# Patient Record
Sex: Female | Born: 1965 | Race: White | Hispanic: No | Marital: Married | Smoking: Current every day smoker
Health system: Southern US, Community
[De-identification: ages and names within clinical notes are randomized; demographics above are authoritative.]

## PROBLEM LIST (undated history)

## (undated) DIAGNOSIS — F32A Depression, unspecified: Secondary | ICD-10-CM

## (undated) DIAGNOSIS — F319 Bipolar disorder, unspecified: Secondary | ICD-10-CM

## (undated) DIAGNOSIS — F329 Major depressive disorder, single episode, unspecified: Secondary | ICD-10-CM

## (undated) DIAGNOSIS — J449 Chronic obstructive pulmonary disease, unspecified: Secondary | ICD-10-CM

## (undated) DIAGNOSIS — F419 Anxiety disorder, unspecified: Secondary | ICD-10-CM

## (undated) HISTORY — DX: Major depressive disorder, single episode, unspecified: F32.9

## (undated) HISTORY — PX: OTHER SURGICAL HISTORY: SHX169

## (undated) HISTORY — PX: FOOT SURGERY: SHX648

## (undated) HISTORY — PX: KNEE SURGERY: SHX244

## (undated) HISTORY — PX: VAGINAL HYSTERECTOMY: SUR661

## (undated) HISTORY — DX: Anxiety disorder, unspecified: F41.9

## (undated) HISTORY — DX: Bipolar disorder, unspecified: F31.9

## (undated) HISTORY — DX: Depression, unspecified: F32.A

---

## 1999-02-07 ENCOUNTER — Other Ambulatory Visit: Admission: RE | Admit: 1999-02-07 | Discharge: 1999-02-07 | Payer: Self-pay | Admitting: Obstetrics and Gynecology

## 1999-05-13 HISTORY — PX: VAGINAL HYSTERECTOMY: SUR661

## 1999-07-15 ENCOUNTER — Observation Stay (HOSPITAL_COMMUNITY): Admission: RE | Admit: 1999-07-15 | Discharge: 1999-07-16 | Payer: Self-pay | Admitting: Obstetrics and Gynecology

## 1999-07-15 ENCOUNTER — Encounter (INDEPENDENT_AMBULATORY_CARE_PROVIDER_SITE_OTHER): Payer: Self-pay | Admitting: Specialist

## 1999-12-02 ENCOUNTER — Encounter: Payer: Self-pay | Admitting: Obstetrics and Gynecology

## 1999-12-02 ENCOUNTER — Ambulatory Visit (HOSPITAL_COMMUNITY): Admission: RE | Admit: 1999-12-02 | Discharge: 1999-12-02 | Payer: Self-pay | Admitting: Obstetrics and Gynecology

## 2003-05-07 ENCOUNTER — Inpatient Hospital Stay (HOSPITAL_COMMUNITY): Admission: EM | Admit: 2003-05-07 | Discharge: 2003-05-07 | Payer: Self-pay | Admitting: Emergency Medicine

## 2003-05-08 ENCOUNTER — Other Ambulatory Visit (HOSPITAL_COMMUNITY): Admission: RE | Admit: 2003-05-08 | Discharge: 2003-05-10 | Payer: Self-pay | Admitting: Psychiatry

## 2003-05-18 ENCOUNTER — Encounter: Admission: RE | Admit: 2003-05-18 | Discharge: 2003-05-18 | Payer: Self-pay | Admitting: Family Medicine

## 2006-09-18 ENCOUNTER — Ambulatory Visit: Payer: Self-pay | Admitting: Family Medicine

## 2006-09-18 DIAGNOSIS — F3289 Other specified depressive episodes: Secondary | ICD-10-CM | POA: Insufficient documentation

## 2006-09-18 DIAGNOSIS — F411 Generalized anxiety disorder: Secondary | ICD-10-CM | POA: Insufficient documentation

## 2006-09-18 DIAGNOSIS — D492 Neoplasm of unspecified behavior of bone, soft tissue, and skin: Secondary | ICD-10-CM

## 2006-09-18 DIAGNOSIS — F329 Major depressive disorder, single episode, unspecified: Secondary | ICD-10-CM

## 2006-09-18 DIAGNOSIS — M130 Polyarthritis, unspecified: Secondary | ICD-10-CM

## 2006-09-18 DIAGNOSIS — F17200 Nicotine dependence, unspecified, uncomplicated: Secondary | ICD-10-CM

## 2006-09-18 DIAGNOSIS — F319 Bipolar disorder, unspecified: Secondary | ICD-10-CM | POA: Insufficient documentation

## 2006-09-18 DIAGNOSIS — M545 Low back pain: Secondary | ICD-10-CM

## 2006-09-21 ENCOUNTER — Encounter (INDEPENDENT_AMBULATORY_CARE_PROVIDER_SITE_OTHER): Payer: Self-pay | Admitting: Family Medicine

## 2006-09-21 ENCOUNTER — Ambulatory Visit (HOSPITAL_COMMUNITY): Admission: RE | Admit: 2006-09-21 | Discharge: 2006-09-21 | Payer: Self-pay | Admitting: Family Medicine

## 2006-09-21 LAB — CONVERTED CEMR LAB
ALT: 22 units/L (ref 0–35)
Alkaline Phosphatase: 61 units/L (ref 39–117)
Basophils Absolute: 0 10*3/uL (ref 0.0–0.1)
Basophils Relative: 0 % (ref 0–1)
CO2: 21 meq/L (ref 19–32)
Creatinine, Ser: 0.65 mg/dL (ref 0.40–1.20)
Eosinophils Absolute: 0 10*3/uL (ref 0.0–0.7)
Eosinophils Relative: 0 % (ref 0–5)
HCT: 42.3 % (ref 36.0–46.0)
Lymphs Abs: 2 10*3/uL (ref 0.7–3.3)
MCHC: 33.6 g/dL (ref 30.0–36.0)
MCV: 96.6 fL (ref 78.0–100.0)
Platelets: 414 10*3/uL — ABNORMAL HIGH (ref 150–400)
RDW: 13.5 % (ref 11.5–14.0)
Sed Rate: 15 mm/hr (ref 0–22)
TSH: 1.3 microintl units/mL (ref 0.350–5.50)
Total Bilirubin: 0.3 mg/dL (ref 0.3–1.2)

## 2006-09-22 ENCOUNTER — Encounter (INDEPENDENT_AMBULATORY_CARE_PROVIDER_SITE_OTHER): Payer: Self-pay | Admitting: Family Medicine

## 2006-09-25 ENCOUNTER — Telehealth (INDEPENDENT_AMBULATORY_CARE_PROVIDER_SITE_OTHER): Payer: Self-pay | Admitting: *Deleted

## 2006-10-02 ENCOUNTER — Ambulatory Visit (HOSPITAL_COMMUNITY): Admission: RE | Admit: 2006-10-02 | Discharge: 2006-10-02 | Payer: Self-pay | Admitting: Family Medicine

## 2006-10-06 ENCOUNTER — Telehealth (INDEPENDENT_AMBULATORY_CARE_PROVIDER_SITE_OTHER): Payer: Self-pay | Admitting: Family Medicine

## 2006-10-15 ENCOUNTER — Ambulatory Visit: Payer: Self-pay | Admitting: Family Medicine

## 2006-10-15 DIAGNOSIS — R7982 Elevated C-reactive protein (CRP): Secondary | ICD-10-CM

## 2006-10-15 DIAGNOSIS — D7289 Other specified disorders of white blood cells: Secondary | ICD-10-CM

## 2006-10-16 ENCOUNTER — Encounter (INDEPENDENT_AMBULATORY_CARE_PROVIDER_SITE_OTHER): Payer: Self-pay | Admitting: Family Medicine

## 2006-10-16 DIAGNOSIS — M25559 Pain in unspecified hip: Secondary | ICD-10-CM | POA: Insufficient documentation

## 2006-10-16 DIAGNOSIS — M25569 Pain in unspecified knee: Secondary | ICD-10-CM

## 2006-10-19 ENCOUNTER — Telehealth (INDEPENDENT_AMBULATORY_CARE_PROVIDER_SITE_OTHER): Payer: Self-pay | Admitting: Family Medicine

## 2007-09-05 ENCOUNTER — Encounter: Admission: RE | Admit: 2007-09-05 | Discharge: 2007-09-05 | Payer: Self-pay | Admitting: Orthopedic Surgery

## 2007-10-07 ENCOUNTER — Ambulatory Visit (HOSPITAL_BASED_OUTPATIENT_CLINIC_OR_DEPARTMENT_OTHER): Admission: RE | Admit: 2007-10-07 | Discharge: 2007-10-07 | Payer: Self-pay | Admitting: Orthopedic Surgery

## 2008-08-31 IMAGING — CR DG LUMBAR SPINE 2-3V
3 series · 3 of 3 positions shown · non-contrast
Comparison: none

CLINICAL DATA: Low back pain

LUMBAR SPINE - 2-3 VIEW:

[view not recorded (1 of 3)]
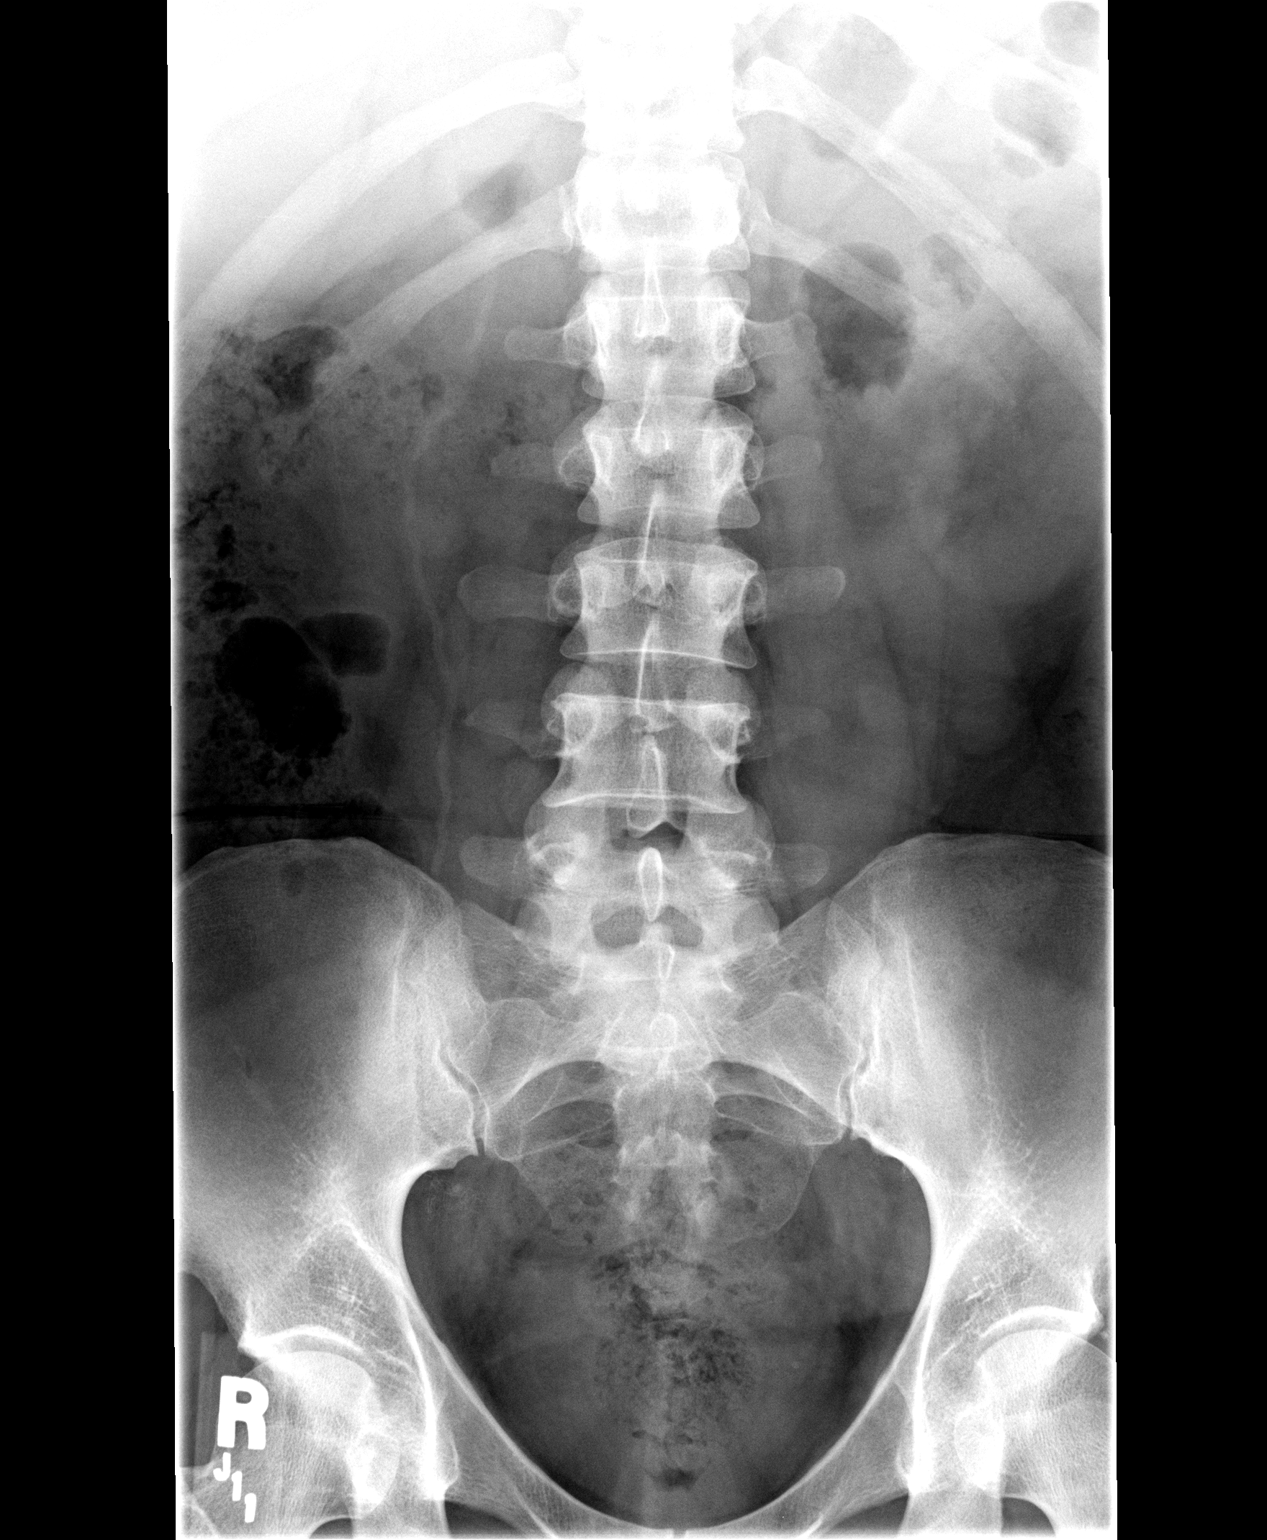

[view not recorded (2 of 3)]
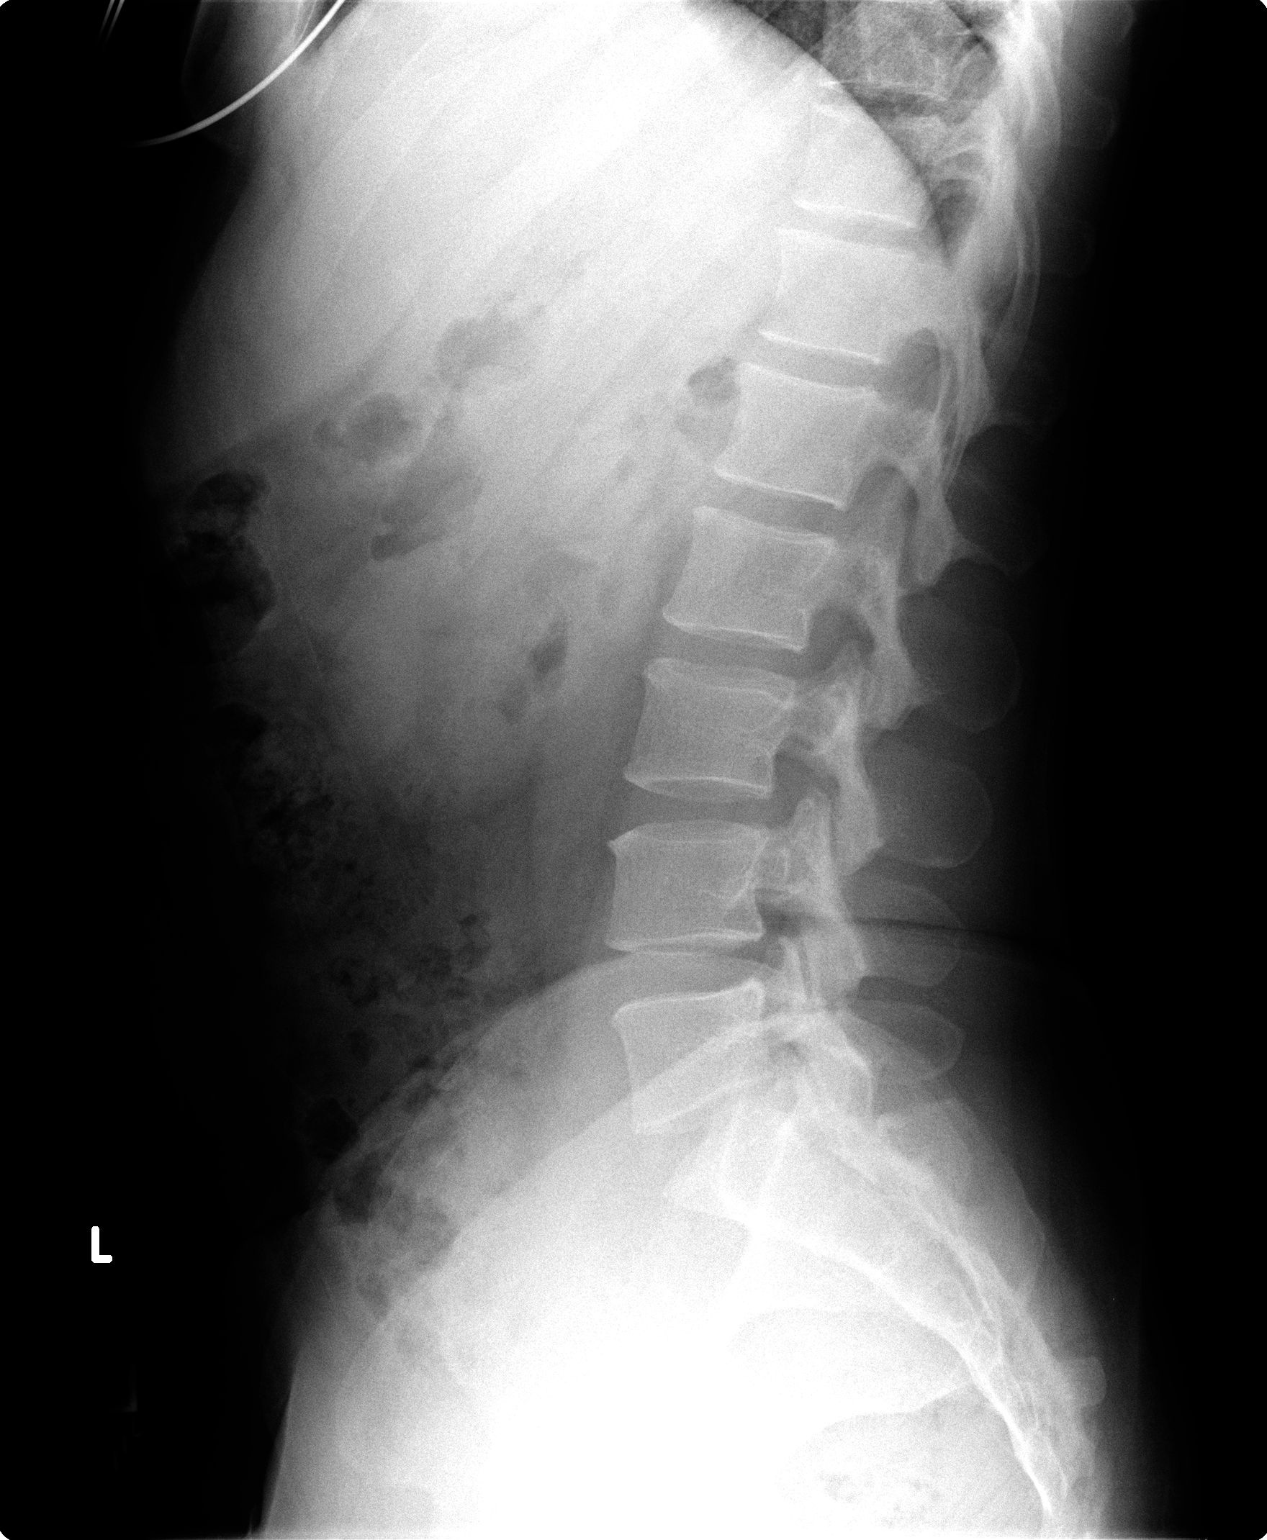

[view not recorded (3 of 3)]
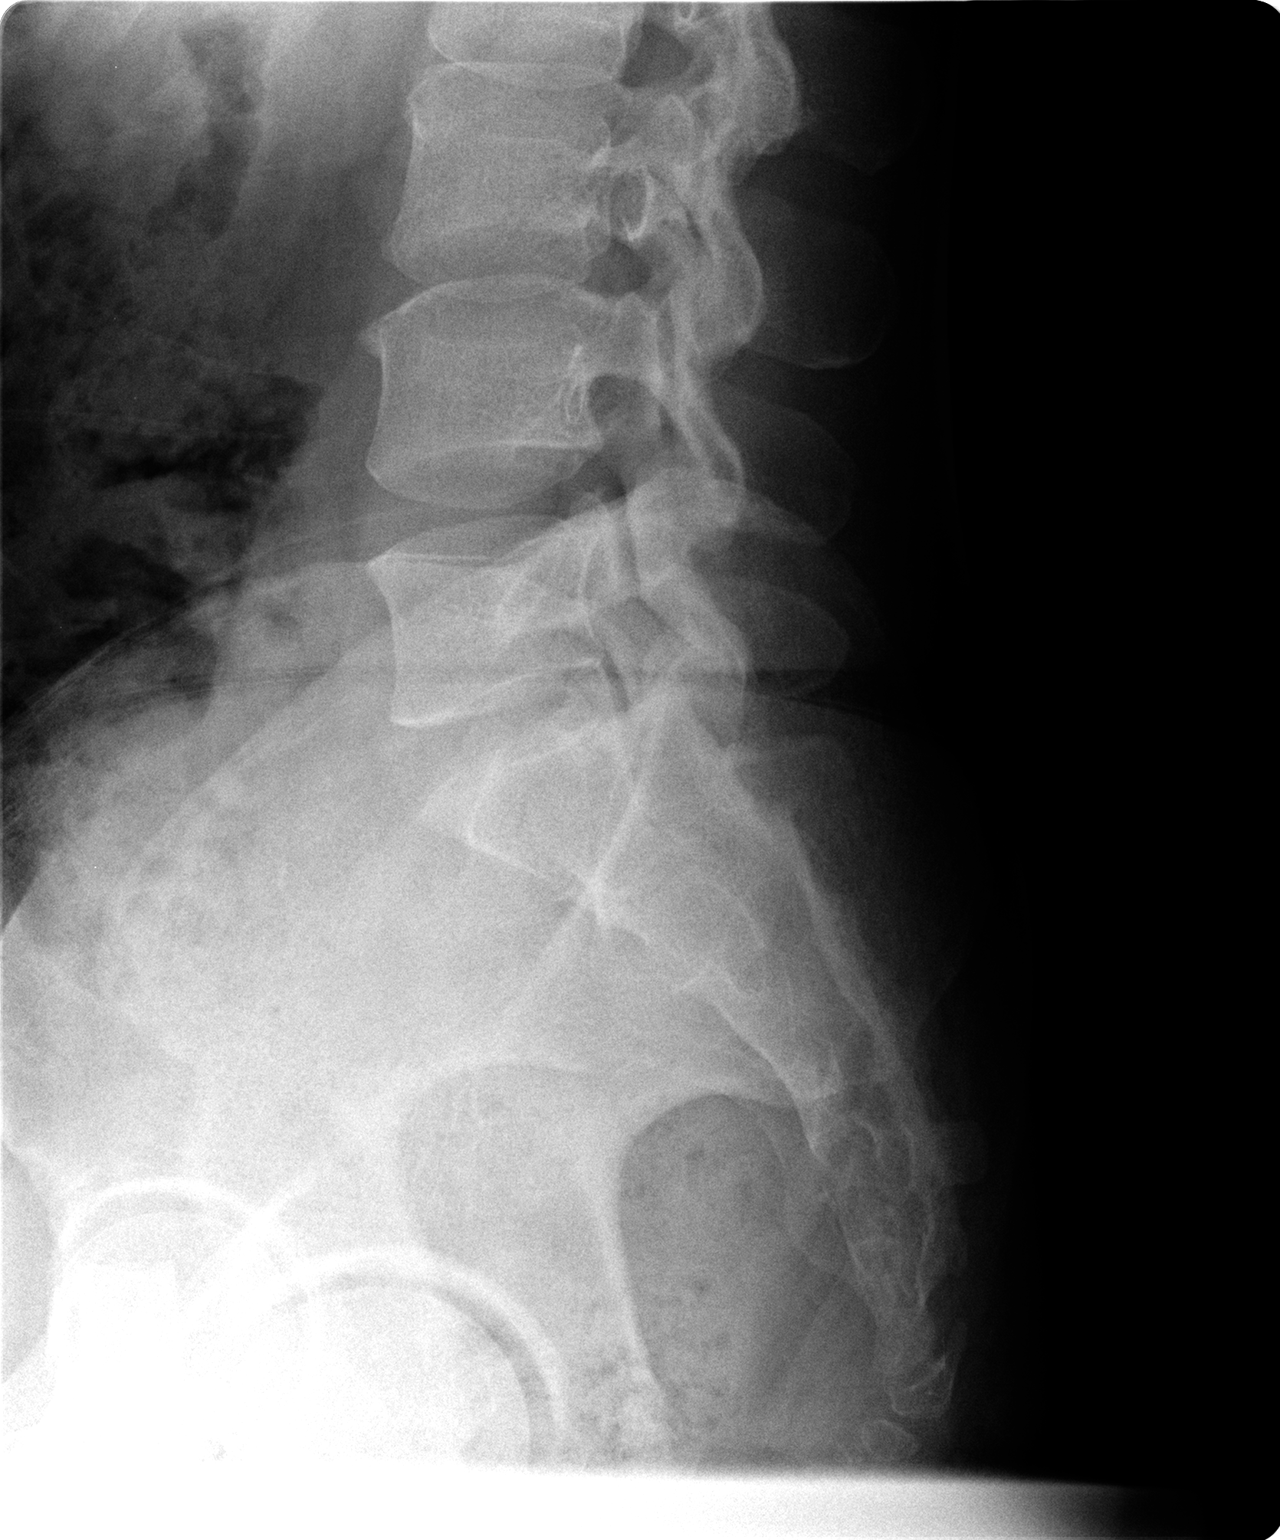

[3 of 3 positions shown; findings below may reference images not displayed]

FINDINGS: There is no evidence of lumbar spine fracture.  Alignment is normal. 
Intervertebral disc spaces are maintained, and no other significant bone
abnormalities are identified.
IMPRESSION: Negative lumbar spine radiographs.

## 2008-08-31 IMAGING — CR DG HIP COMPLETE 2+V*R*
3 series · 3 of 3 positions shown · non-contrast
Comparison: none

CLINICAL DATA: Low back pain, right leg radiculopathy.

RIGHT HIP - 2  VIEW AND PELVIS - 1 VIEW

[view not recorded (1 of 3)]
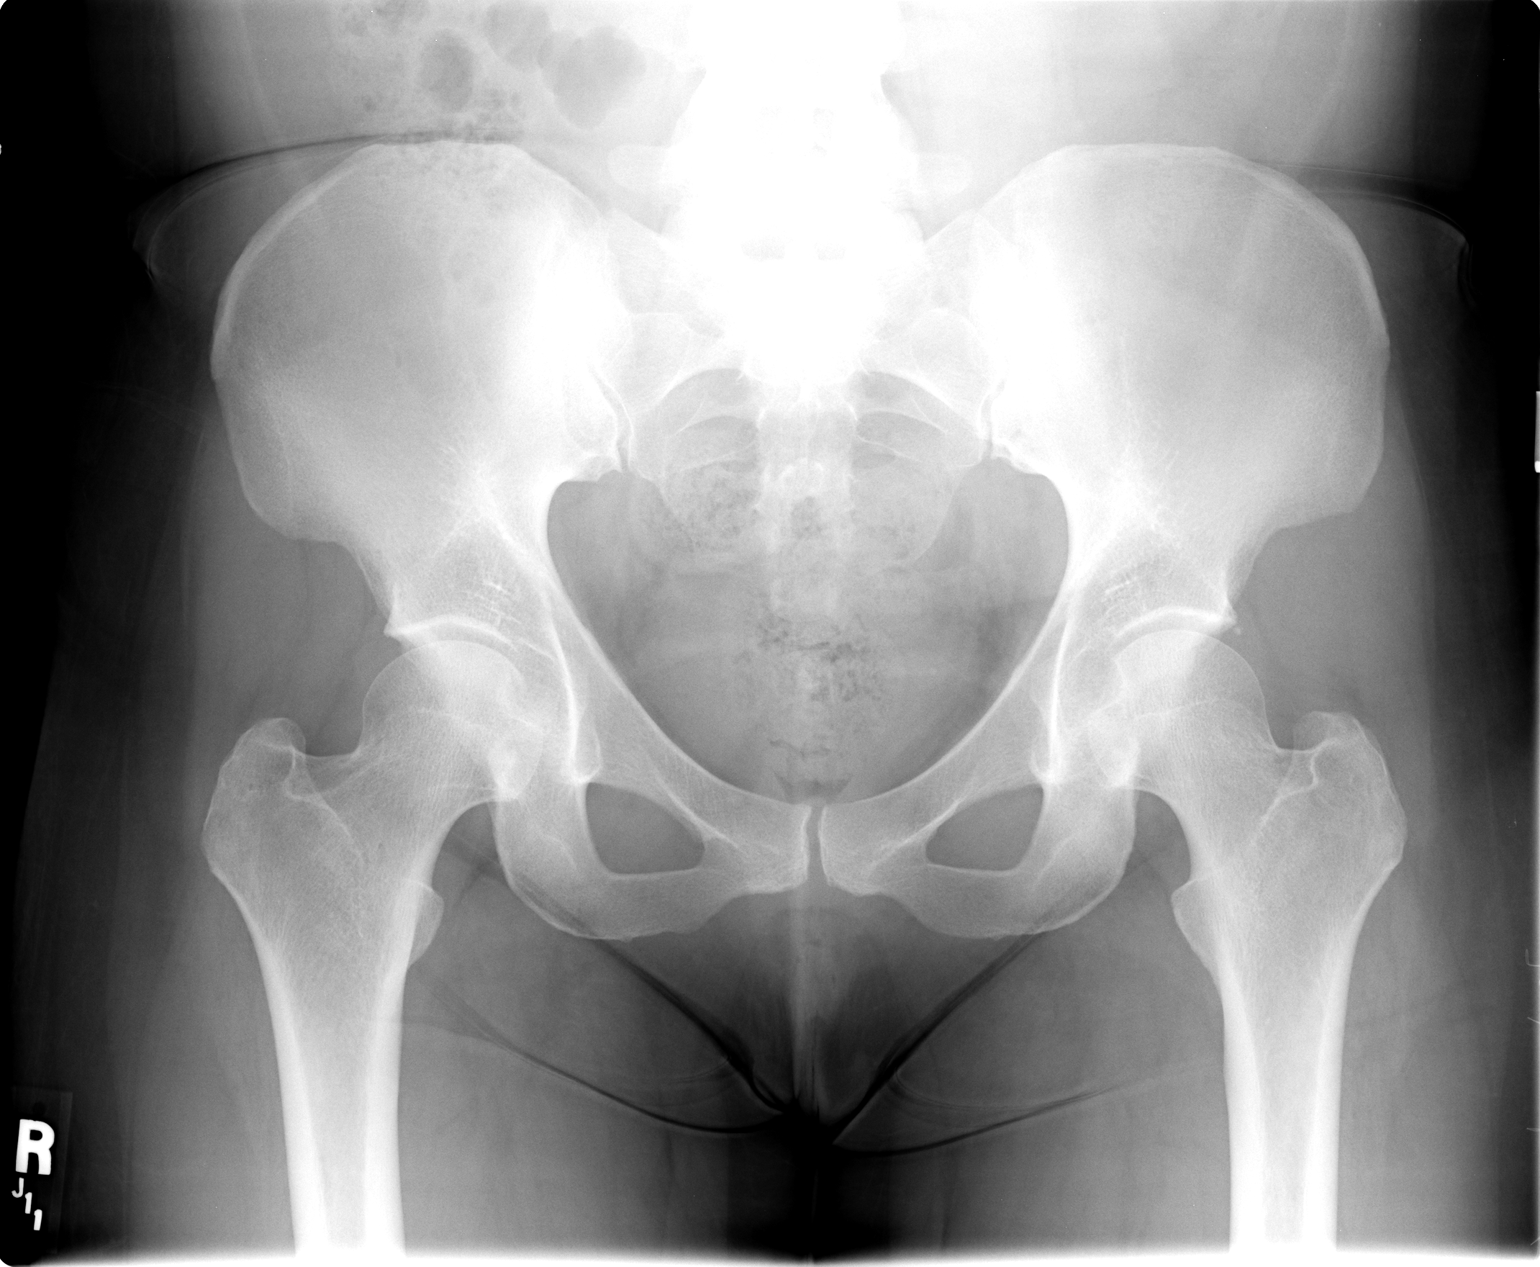

[view not recorded (2 of 3)]
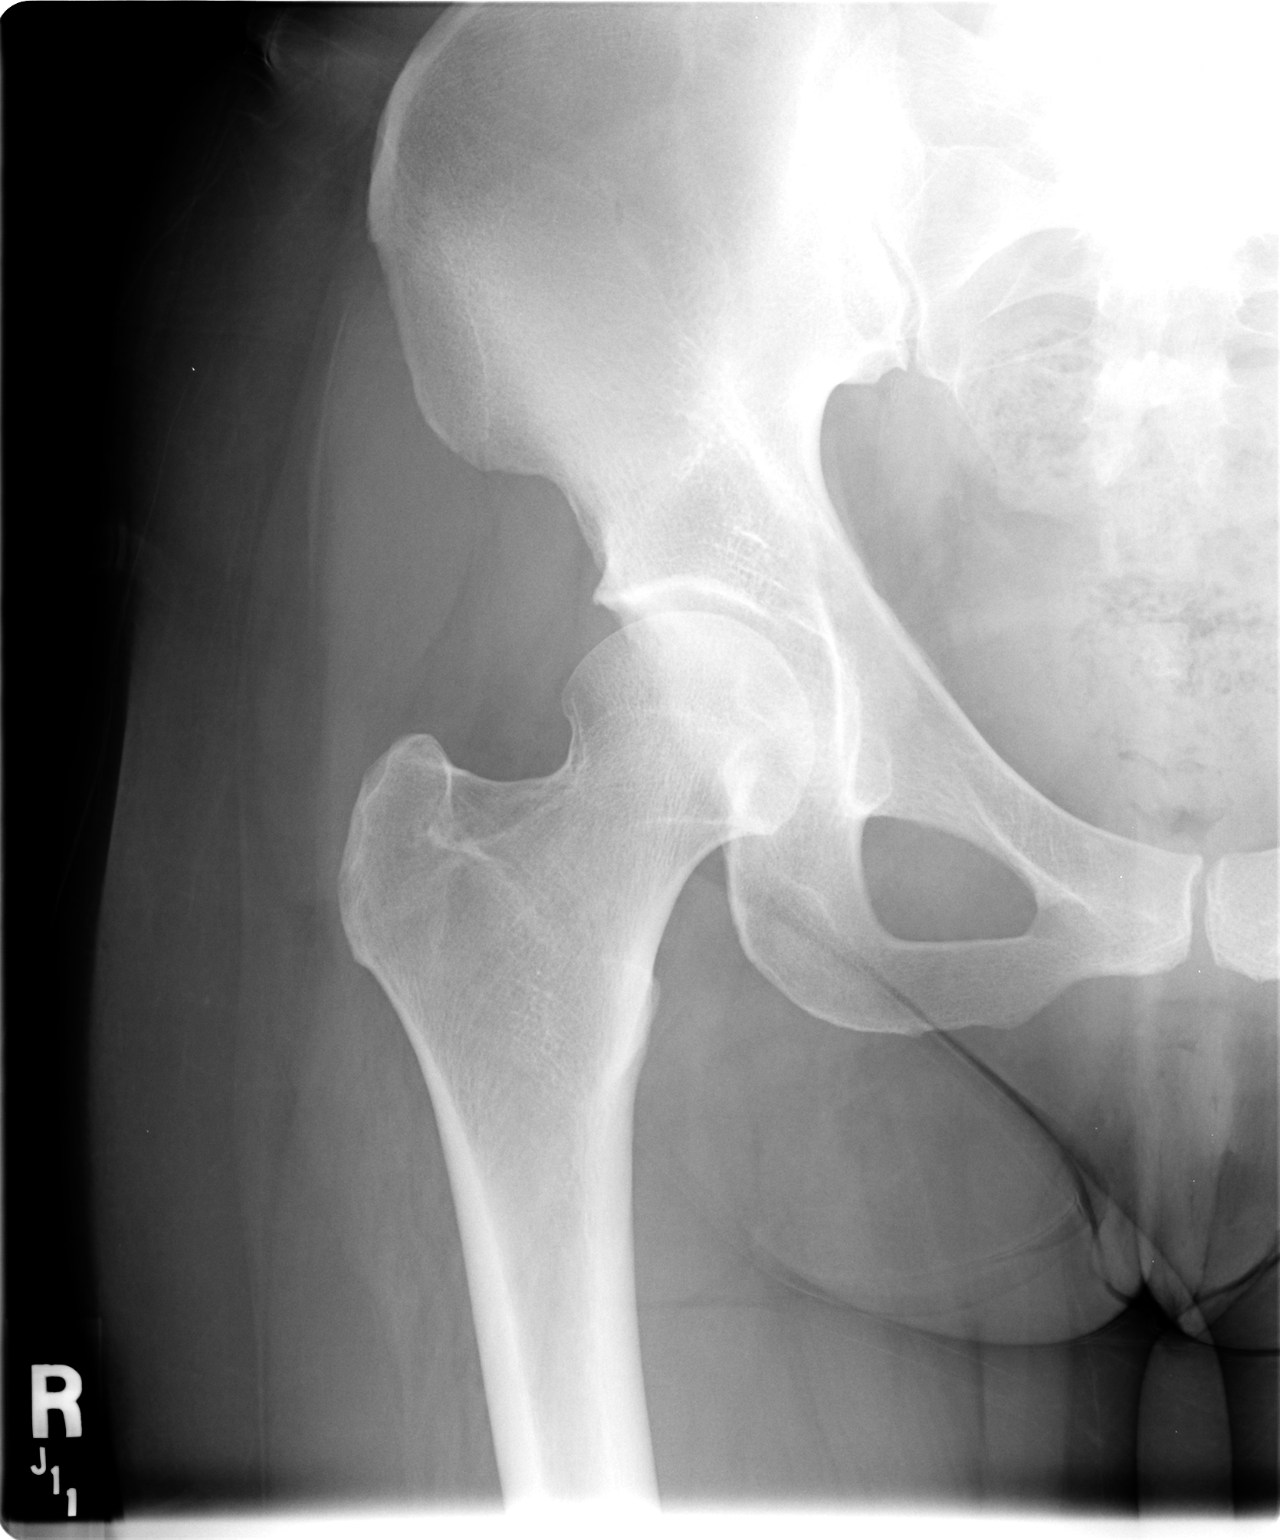

[view not recorded (3 of 3)]
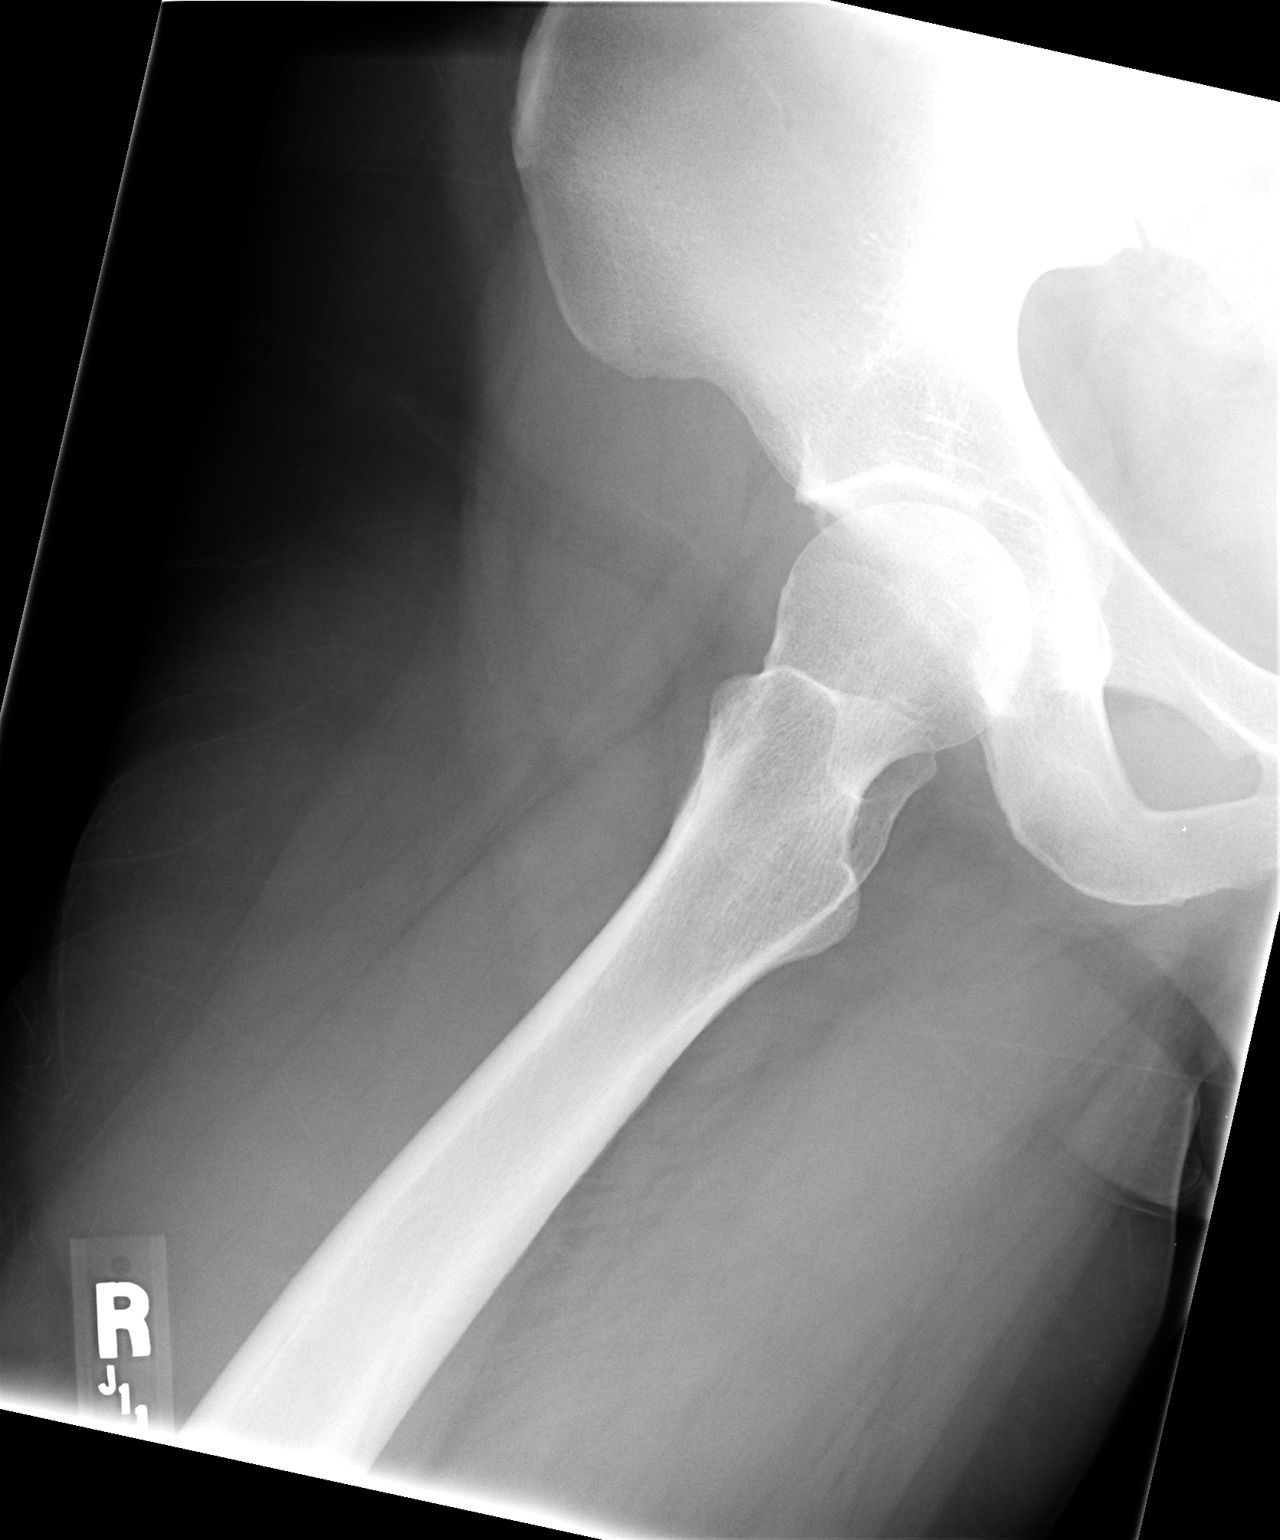

[3 of 3 positions shown; findings below may reference images not displayed]

FINDINGS: There is no evidence of hip fracture or dislocation.  There is no
evidence of arthropathy or other focal bone abnormality involving the hip or
pelvis.

IMPRESSION

Negative.

## 2008-11-03 ENCOUNTER — Ambulatory Visit: Payer: Self-pay | Admitting: Cardiology

## 2008-11-03 ENCOUNTER — Ambulatory Visit (HOSPITAL_COMMUNITY): Admission: RE | Admit: 2008-11-03 | Discharge: 2008-11-03 | Payer: Self-pay | Admitting: Pulmonary Disease

## 2008-11-03 ENCOUNTER — Encounter (INDEPENDENT_AMBULATORY_CARE_PROVIDER_SITE_OTHER): Payer: Self-pay | Admitting: Pulmonary Disease

## 2010-06-02 ENCOUNTER — Encounter: Payer: Self-pay | Admitting: Family Medicine

## 2010-09-24 NOTE — Op Note (Signed)
Teresa Williamson, CHALUPA             ACCOUNT NO.:  [id number]   MEDICAL RECORD NO.:  [id number]          PATIENT TYPE:  AMB   LOCATION:  DSC                          FACILITY:  [facility]   PHYSICIAN:  Rodney A. Mortenson, M.D.DATE OF BIRTH:  [birth year]   DATE OF PROCEDURE:  10/07/2007  DATE OF DISCHARGE:                               OPERATIVE REPORT   JUSTIFICATION:  A 45 year old female referred to [place] again with exam by  Dr. [name] in [place] for evaluation of right knee.  Two years ago,  she first started having trouble with her knee.  She continued to have  pain and discomfort along the medial joint line and some catching  sensations.  Symptoms are progressively getting worse, and she seeks  evaluation.  An MRI shows an oblique tear of the posterior horn and the  body of the medial meniscus without displacement.  Some mild  chondromalacia in the medial lateral compartments.  Because of  persistent pain and discomfort, she is now admitted for arthroscopic  evaluation and treatment.  Complications were discussed extensively.  Questions answered and encouraged, and the patient wishes to proceed.   JUSTIFICATION OF PATIENT SURGERY:  Minimal morbidity.   PREOPERATIVE DIAGNOSIS:  Tear, posterior horn medial meniscus, right  knee.   POSTOPERATIVE DIAGNOSIS:  Tear, posterior horn medial meniscus, right  knee.   OPERATION:  Debride posterior horn medial meniscus, right knee.   SURGEON:  Lenard Galloway. Chaney Malling, MD.   ANESTHESIA:  General.   PATHOLOGY:  None.   FINDINGS:  With an arthroscope, a very careful examination of the knee  was undertaken.  The patellofemoral joint was visualized first.  No  abnormality was seen.  The anterior cruciate ligament was normal.  In  the lateral compartment, there was normal articular contralateral  femoral condyle and lateral tibial plateau, and the entire circumference  of the lateral meniscus was normal.  In the medial compartment, there  was  normal articular cartilage of the medial femoral condyle and medial  tibial plateau, but there was a complex tear of the posterior horn of  medial meniscus and this could be displaced quite easily with a probe.  This was markedly frayed and torn.   PROCEDURE:  The patient was placed on the operating table in the supine  position with a pneumatic tourniquet above the right upper thigh.  The  right leg was placed in a leg holder, and entire right lower extremity  prepped with DuraPrep and draped out in the usual manner.  An infusion  can was placed in the superior medial pouch and the knee distended with  saline.  Anteromedial and anterolateral portals were made and the  arthroscope was introduced.  The findings were described as above.  The  only significant pathology seen in the medial compartment.  Through both  the anteromedial and anterolateral portals, a series of baskets were  inserted and torn portion of the meniscus was extensively debrided.  This was followed up with the intra-articular shaver.  Debris was  removed.  The remaining posterior rim was then smoothed and balanced  nicely and transition in  the mid third of the medial meniscus.  Excellent decompression of the torn portion of the meniscus was achieved  very nicely.  Knee was then filled with Marcaine.  A large bulky  pressure dressing was applied and the patient returned to the recovery  room in excellent condition.  Technically, this procedure went extremely  well.   DISPOSITION:  1. Percocet for pain.  2. To my office on Wednesday of next week.  3. Usual postop instructions.      Rodney A. Chaney Malling, M.D.  Electronically Signed     RAM/MEDQ  D:  10/07/2007  T:  10/07/2007  Job:  [id number]   cc:   Ramon Dredge L. [name], M.D.

## 2010-09-27 NOTE — Discharge Summary (Signed)
Teresa Williamson, Teresa Williamson NO.:  [id number]   MEDICAL RECORD NO.:  [id number]                   PATIENT TYPE:  INP   LOCATION:  1823                                 FACILITY:  [facility]   PHYSICIAN:  Franchot Mimes, MD                   DATE OF BIRTH:  1965/09/09   DATE OF ADMISSION:  05/07/2003  DATE OF DISCHARGE:  05/07/2003                                 DISCHARGE SUMMARY   DISCHARGE DIAGNOSIS:  Benzodiazepine overdose.   HISTORY OF PRESENT ILLNESS:  Teresa Williamson is a 45 year old, Caucasian female  who presented to the [facility] Emergency Department following an afternoon  of excessive drinking and taking an unknown quantity of several types of  pills.  The patient became increasingly lethargic during the evening  following her drinking episode and after taking the pills.  Apparently, the  patient's husband found the pill bottle opened with fewer pills than he  remembered being in there and the wife admitted to taking an unknown  quantity of pills.  During the evening, the patient became less responsive  and she was brought to the emergency department.  Of note, the patient has  had a past medical history significant for depression with one inpatient  psychiatric evaluation approximately eight years ago.   PHYSICAL EXAMINATION:  VITAL SIGNS:  Stable.  GENERAL:  The patient was somnolent, arousable only to sternal rub.  However, approximately 10 minutes following the exam, the patient was up  walking to go use the restroom.  HEENT:  Pupils equal round and reactive to light.  Normal dentition and  moist mucous membranes.  CHEST:  Clear.  CARDIAC:  Regular rate and rhythm without murmurs.  ABDOMEN:  Benign.  EXTREMITIES:  No clubbing, cyanosis or edema.  No ecchymosis.   LABORATORY DATA AND X-RAY FINDINGS:  Urine drug screen revealed positive  test for benzodiazepines only.  A comprehensive metabolic panel was within  normal limits.  Urine pregnancy test  was negative.  Acetaminophen level  taken at 4:45 was less than 10.  A followup acetaminophen level at 10:09 was  also less than 10.  Salicylate level taken at the same times were both less  than 4.   HOSPITAL COURSE:  Given that the patient was completely responsive, coherent  and up walking around following the initial physical exam, the behavioral  health team was contacted to evaluate the patient.  The patient was cleared  medically given that her laboratory work showed nontoxic salicylate and  acetaminophen levels and that her vital signs were stable.  In addition, an  EKG was taken which was also normal.  It is believed that one of the pills  that the patient took was Lexapro.  It is very likely that another pill was  possibly Valium or some other benzodiazepine.  A third pill, at the time of  discharge, had yet to be identified by  the pharmacy.  The patient was given  charcoal in the ED, but did not have any emesis.   DISPOSITION:  The patient was discharged to Behavior Health.   DISCHARGE MEDICATIONS:  None.   FOLLOW UP:  The patient is to receive appropriate psychiatric intervention  and treatment per Behavioral Health.                                                Franchot Mimes, MD    TV/MEDQ  D:  06/06/2003  T:  06/07/2003  Job:  [id number]

## 2010-09-27 NOTE — Op Note (Signed)
[facility]  Patient:    Teresa Williamson, Teresa Williamson                    MRN: [id number] Proc. Date: 07/15/99 Adm. Date:  [id number] Attending:  Cordelia Pen Ii                           Operative Report  PREOPERATIVE DIAGNOSIS: 1. Uterine leiomyomata. 2. Menorrhagia. 3. Dysmenorrhea.  POSTOPERATIVE DIAGNOSIS: 1. Uterine leiomyomata. 2. Menorrhagia. 3. Dysmenorrhea. 4. Adhesions.  OPERATION:  Laparoscopically assisted vaginal hysterectomy with lysis of adhesions.  SURGEON:  Guy Sandifer. Arleta Creek, M.D.  ASSISTANT:  Juluis Mire, M.D.  ANESTHESIA:  General with endotracheal intubation.  ESTIMATED BLOOD LOSS:  100 cc.  INDICATIONS AND CONSENT:  The patient is a 45 year old married white female, G3, P3, status post tubal ligation with increasing dysmenorrhea and menorrhagia and  known leiomyomata.  Details are dictated in the history and physical. Laparoscopically assisted vaginal hysterectomy and removal of one ovary only, only if distinctly abnormal was discussed.  Possible risks and complications were discussed including but not limited to infection, bowel, bladder or ureteral damage, bleeding requiring transfusion of blood products with possible transfusion reaction, HIV, or hepatitis acquisition, DVT and PE, pneumonia, fistula formation and the necessity of laparotomy.  All questions were answered and consent was signed on the chart.  FINDINGS:  Upper abdomen was normal.  There was point of adhesion of omentum to the anterior abdominal wall immediately to the right of the umbilicus.  This does noit involve any bowel.  The uterus is 6 to 9 weeks in size.  Anterior cul-de-sac is  normal.  The posterior cul-de-sac is normal.  The tubes are status post ligation bilaterally.  Ovaries are normal bilaterally.  The left pelvic side wall contained a single white area possibly consistent with endometriosis although this  was not definitive.  It was located well above the course of the ureter.  The right pelvic side wall and posterior cul-de-sac were normal.  DESCRIPTION OF PROCEDURE:  The patient was taken to the operating room and placed in dorsal lithotomy position where general anesthesia was induced with endotracheal intubation.  She was then placed in dorsal lithotomy position where she was prepped abdominally and vaginally.  Bladder straight catheterized.  Hulka tenaculum was  placed in the uterus as a manipulator and she was draped in a sterile fashion. A small umbilical incision was made and the 12 mm disposable trocar sleeve was placed without difficulty.  Placement was verified with the laparoscope and no damage o surrounding structures was noted.  A pneumoperitoneum was induced and a small suprapubic and later left lower quadrant incisions were made after careful transillumination and 5 mm nondisposable trocar sleeves were placed under direct visualization without difficulty.  The above findings were noted.  Bipolar cautery was used to cauterize the point of omental adhesion close to the anterior abdominal wall and then this adhesion was taken down.  Good hemostasis was noted on both pedicles.  Then a 5 mm laparoscope was used through the left lower quadrant trocar sleeve and the disposable stapler with the white cartridge was used to take down the proximal ligaments bilaterally with two bites on the right and one bite on he left.  Reinspection with operative laparoscope reveals good hemostasis.  The vesicouterine peritoneum was then incised in the middle, hydrodissected and taken down for approximately 3 cm in the midline.  Instruments were removed and attention was turned to the vagina.  The posterior cul-de-sac was entered sharply and the  cervix was then circumscribed with a scalpel.  The mucosa was advanced sharply nd bluntly.  The uterosacral ligaments were then taken  bilaterally and ligated with transfixion sutures of 0 Monocryl.  All sutures will be 0 Monocryl unless otherwise designated.  Bladder pillars were then taken bilaterally.  The anterior cul-de-sac was then entered sharply and bluntly without difficulty.  Cardinal ligaments followed by the uterine vessels followed by two bites above this level was taken bilaterally and all were singly ligated.  The fundus was then delivered posteriorly.  The proximal ligaments were clamped and cut and the uterus was delivered.  The right pedicle was singly ligated and the left pedicle was doubly ligated with free ties.  The uterosacral ligament was then plicated to the vaginal cuff bilaterally.  The uterosacral ligaments were then plicated to the midline ith a single suture.  The cuff was then closed with figure-of-eight sutures.  A Foley catheter was placed and clear urine was noted.  Attention was returned to the abdomen.  Copious irrigation was carried out and excellent hemostasis was noted at all points.  No additional cautery was required.  Trocar sleeves were removed inferiorly and the pneumoperitoneum was reduced.  No bleeding was noted from any site.  The pneumoperitoneum was completely reduced.  The umbilical trocar sleeve was removed and the umbilical incision was closed first with a 0 Vicryl suture n the deeper underlying layers with care being taken not to pick up any underlying structures.  Skin incisions were closed with 3-0 subcuticular Vicryl suture. Incisions were injected with 0.5% plain Marcaine.  Dressings were applied.  All  counts were correct.  The patient was awakened and taken to the recovery room in stable condition. DD:  07/15/99 TD:  07/16/99 Job: [id number]

## 2010-09-27 NOTE — Discharge Summary (Signed)
[facility]  Patient:    Teresa Williamson, Teresa Williamson                    MRN: [id number] Adm. Date:  [id number] Disc. Date: [id number] Attending:  Cordelia Pen Ii                           Discharge Summary  ADMISSION DIAGNOSES:          1. Uterine leiomyomata.                               2. Menorrhagia.                               3. Dysmenorrhea.  DISCHARGE DIAGNOSES:          1. Uterine leiomyomata.                               2. Menorrhagia.                               3. Dysmenorrhea.  PROCEDURE:                    On July 15, 1999, laparoscopically-assisted vaginal hysterectomy and lysis of adhesions.  REASON FOR ADMISSION:         This patient is a 45 year old married white female, gravida 3, para 3, status post tubal ligation, with increasing dysmenorrhea. Details are dictated in the history and physical.  She was admitted for surgical therapy.  HOSPITAL COURSE:              She undergoes the above procedure without complications.  Estimated blood loss was 100 cc.  On the evening of surgery, she was ambulating well, voiding, and had stable vital signs with good urine output. On the day of discharge, she is ambulating, passing flatus, and tolerating a regular diet.  She remains afebrile.  Abdomen is soft with good bowel sounds. Hemoglobin is 10.6.  CONDITION ON DISCHARGE:       Good.  DIET:                         Regular as tolerated.  ACTIVITIES:                   No lifting, no operation of automobiles, and no vaginal entry.  She is to call the office for problems including, but not limited to temperature over 100 degrees, increasing pain, persistent nausea and vomiting, or heavy vaginal bleeding.  LABORATORY DATA:              On July 16, 1999, WBC 7.0, hemoglobin 10.6, hematocrit 31.2.  DISCHARGE MEDICATIONS:        1. Tylox #30 one to two p.o. q.6h. p.r.n.                               2. Multivitamin  daily.  FOLLOW-UP:                    Follow up in the office in two weeks. DD:  07/16/99 TD:  07/17/99 Job: [id number]

## 2011-01-09 ENCOUNTER — Ambulatory Visit (HOSPITAL_COMMUNITY)
Admission: RE | Admit: 2011-01-09 | Discharge: 2011-01-09 | Disposition: A | Discharge status: Home / Self Care | Payer: Managed Care, Other (non HMO) | Source: Ambulatory Visit | Attending: Pulmonary Disease | Admitting: Pulmonary Disease

## 2011-01-09 ENCOUNTER — Other Ambulatory Visit (HOSPITAL_COMMUNITY): Payer: Self-pay | Admitting: Pulmonary Disease

## 2011-01-09 DIAGNOSIS — M25511 Pain in right shoulder: Secondary | ICD-10-CM

## 2011-01-09 DIAGNOSIS — M25519 Pain in unspecified shoulder: Secondary | ICD-10-CM | POA: Insufficient documentation

## 2011-01-23 ENCOUNTER — Other Ambulatory Visit (HOSPITAL_COMMUNITY): Payer: Self-pay | Admitting: Pulmonary Disease

## 2011-01-23 DIAGNOSIS — M25519 Pain in unspecified shoulder: Secondary | ICD-10-CM

## 2011-01-27 ENCOUNTER — Ambulatory Visit (HOSPITAL_COMMUNITY)
Admission: RE | Admit: 2011-01-27 | Discharge: 2011-01-27 | Disposition: A | Discharge status: Home / Self Care | Payer: Managed Care, Other (non HMO) | Source: Ambulatory Visit | Attending: Pulmonary Disease | Admitting: Pulmonary Disease

## 2011-01-27 DIAGNOSIS — M75100 Unspecified rotator cuff tear or rupture of unspecified shoulder, not specified as traumatic: Secondary | ICD-10-CM | POA: Insufficient documentation

## 2011-01-27 DIAGNOSIS — IMO0002 Reserved for concepts with insufficient information to code with codable children: Secondary | ICD-10-CM | POA: Insufficient documentation

## 2011-01-27 DIAGNOSIS — M25519 Pain in unspecified shoulder: Secondary | ICD-10-CM | POA: Insufficient documentation

## 2011-02-11 ENCOUNTER — Other Ambulatory Visit (HOSPITAL_COMMUNITY): Payer: Self-pay | Admitting: Pulmonary Disease

## 2011-02-11 DIAGNOSIS — Z139 Encounter for screening, unspecified: Secondary | ICD-10-CM

## 2011-02-20 ENCOUNTER — Ambulatory Visit (HOSPITAL_COMMUNITY)
Admission: RE | Admit: 2011-02-20 | Discharge: 2011-02-20 | Disposition: A | Discharge status: Home / Self Care | Payer: Managed Care, Other (non HMO) | Source: Ambulatory Visit | Attending: Pulmonary Disease | Admitting: Pulmonary Disease

## 2011-02-20 DIAGNOSIS — Z139 Encounter for screening, unspecified: Secondary | ICD-10-CM

## 2011-02-20 DIAGNOSIS — Z1231 Encounter for screening mammogram for malignant neoplasm of breast: Secondary | ICD-10-CM | POA: Insufficient documentation

## 2011-03-03 ENCOUNTER — Other Ambulatory Visit: Payer: Self-pay | Admitting: Pulmonary Disease

## 2011-03-03 DIAGNOSIS — R928 Other abnormal and inconclusive findings on diagnostic imaging of breast: Secondary | ICD-10-CM

## 2011-03-26 ENCOUNTER — Other Ambulatory Visit: Payer: Self-pay | Admitting: Pulmonary Disease

## 2011-03-26 ENCOUNTER — Ambulatory Visit (HOSPITAL_COMMUNITY)
Admission: RE | Admit: 2011-03-26 | Discharge: 2011-03-26 | Disposition: A | Discharge status: Home / Self Care | Payer: Managed Care, Other (non HMO) | Source: Ambulatory Visit | Attending: Pulmonary Disease | Admitting: Pulmonary Disease

## 2011-03-26 DIAGNOSIS — R928 Other abnormal and inconclusive findings on diagnostic imaging of breast: Secondary | ICD-10-CM | POA: Insufficient documentation

## 2014-04-24 ENCOUNTER — Other Ambulatory Visit (HOSPITAL_COMMUNITY): Payer: Self-pay | Admitting: Family Medicine

## 2014-04-24 DIAGNOSIS — R609 Edema, unspecified: Secondary | ICD-10-CM

## 2014-04-26 ENCOUNTER — Ambulatory Visit (HOSPITAL_COMMUNITY)
Admission: RE | Admit: 2014-04-26 | Discharge: 2014-04-26 | Disposition: A | Discharge status: Home / Self Care | Payer: Managed Care, Other (non HMO) | Source: Ambulatory Visit | Attending: Family Medicine | Admitting: Family Medicine

## 2014-04-26 DIAGNOSIS — R609 Edema, unspecified: Secondary | ICD-10-CM

## 2014-04-26 DIAGNOSIS — Z1231 Encounter for screening mammogram for malignant neoplasm of breast: Secondary | ICD-10-CM | POA: Insufficient documentation

## 2014-04-27 ENCOUNTER — Ambulatory Visit (HOSPITAL_COMMUNITY): Payer: Managed Care, Other (non HMO)

## 2016-03-17 ENCOUNTER — Other Ambulatory Visit (HOSPITAL_COMMUNITY): Payer: Self-pay | Admitting: Family Medicine

## 2016-03-17 DIAGNOSIS — Z1231 Encounter for screening mammogram for malignant neoplasm of breast: Secondary | ICD-10-CM

## 2016-03-27 ENCOUNTER — Ambulatory Visit (HOSPITAL_COMMUNITY)
Admission: RE | Admit: 2016-03-27 | Discharge: 2016-03-27 | Disposition: A | Discharge status: Home / Self Care | Payer: Managed Care, Other (non HMO) | Source: Ambulatory Visit | Attending: Family Medicine | Admitting: Family Medicine

## 2016-03-27 DIAGNOSIS — Z1231 Encounter for screening mammogram for malignant neoplasm of breast: Secondary | ICD-10-CM | POA: Diagnosis present

## 2016-12-09 ENCOUNTER — Ambulatory Visit: Payer: Self-pay | Admitting: Adult Health

## 2016-12-23 ENCOUNTER — Ambulatory Visit: Payer: Self-pay | Admitting: Adult Health

## 2017-01-02 ENCOUNTER — Encounter: Payer: Self-pay | Admitting: Adult Health

## 2017-01-02 ENCOUNTER — Ambulatory Visit (INDEPENDENT_AMBULATORY_CARE_PROVIDER_SITE_OTHER): Payer: BLUE CROSS/BLUE SHIELD | Admitting: Adult Health

## 2017-01-02 ENCOUNTER — Encounter (INDEPENDENT_AMBULATORY_CARE_PROVIDER_SITE_OTHER): Payer: Self-pay

## 2017-01-02 VITALS — BP 122/72 | HR 66 | Ht 63.0 in | Wt 185.0 lb

## 2017-01-02 DIAGNOSIS — Z90710 Acquired absence of both cervix and uterus: Secondary | ICD-10-CM

## 2017-01-02 DIAGNOSIS — N941 Unspecified dyspareunia: Secondary | ICD-10-CM

## 2017-01-02 LAB — POCT WET PREP (WET MOUNT)
CLUE CELLS WET PREP WHIFF POC: NEGATIVE
WBC WET PREP: POSITIVE

## 2017-01-02 MED ORDER — URIBEL 118 MG PO CAPS: ORAL_CAPSULE | ORAL | 1 refills | Status: DC

## 2017-01-02 NOTE — Patient Instructions (Signed)
Dyspareunia, Female Dyspareunia is pain that is associated with sexual activity. This can affect any part of the genitals or lower abdomen, and there are many possible causes. This condition ranges from mild to severe. Depending on the cause, dyspareunia may get better with treatment, or it may return (recur) over time. What are the causes? The cause of this condition is not always known. Possible causes include:  Cancer.  Psychological factors, such as depression, anxiety, or previous traumatic experiences.  Severe pain and tenderness of the skin around the vagina (vulva) when it is touched (vulvar vestibulitis syndrome).  Infection of the pelvis or the vulva.  Infection of the vagina.  Painful, involuntary tightening (contraction) of the vaginal muscles when anything is put inside the vagina (vaginismus).  Allergic reaction.  Ovarian cysts.  Solid growths of tissue (tumors) in the ovaries or the uterus.  Scar tissue in the ovaries, vagina, or pelvis.  Vaginal dryness.  Thinning of the tissue (atrophy) of the vulva and vagina.  Skin conditions that affect the vulva (vulvar dermatoses), such as lichen sclerosus or lichen planus.  Endometriosis.  Tubal pregnancy.  A tilted uterus.  Uterine prolapse.  Adhesions in the vagina.  Bladder problems.  Intestinal problems.  Certain medicines.  Medical conditions such as diabetes, arthritis, or thyroid disease.  What increases the risk? The following factors may make you more likely to develop this condition:  Having experienced physical or sexual trauma.  Having given birth more than once.  Taking birth control pills.  Having gone through menopause.  Having recently given birth, typically within the past 3-6 months.  Breastfeeding.  What are the signs or symptoms? The main symptom of this condition is pain in any part of the genitals or lower abdomen during or after sexual activity. This may include pain  during sexual arousal, genital stimulation, or orgasm. Pain may get worse when anything is inserted into the vagina, or when the genitals are touched in any way, such as when sitting or wearing pants. Pain can range from mild to severe, depending on the cause of the condition. In some cases, symptoms go away with treatment and return (recur) at a later date. How is this diagnosed? This condition may be diagnosed based on:  Your symptoms, including: ? Where your pain is located. ? When your pain occurs.  Your medical history.  A physical exam. This may include a pelvic exam and a Pap test. This is a screening test that is used to check for signs of cancer of the vagina, cervix, and uterus.  Tests, including: ? Blood tests. ? Ultrasound. This uses sound waves to make a picture of the area that is being tested. ? Urine culture. This test involves checking a urine sample for signs of infection. ? Culture test. This is when your health care provider uses a swab to collect a sample of vaginal fluid. The sample is checked for signs of infection. ? X-rays. ? MRI. ? CT scan. ? Laparoscopy. This is a procedure in which a small incision is made in your lower abdomen and a lighted, pencil-sized instrument (laparoscope) is passed through the incision and used to look inside your pelvis.  You may be referred to a health care provider who specializes in women's health (gynecologist). In some cases, diagnosing the cause of dyspareunia can be difficult. How is this treated? Treatment depends on the cause of your condition and your symptoms. In most cases, you may need to stop sexual activity until your symptoms   improve. Treatment may include:  Lubricants.  Kegel exercises or vaginal dilators.  Medicated skin creams.  Medicated vaginal creams.  Hormonal therapy.  Antibiotic medicine to prevent or fight infection.  Medicines that help to relieve pain.  Medicines that treat depression  (antidepressants).  Psychological counseling.  Sex therapy.  Surgery.  Follow these instructions at home: Lifestyle  Avoid tight clothing and irritating materials around your genital and abdominal area.  Use water-based lubricants as needed. Avoid oil-based lubricants.  Do not use any products that irritate you. This may include certain condoms, spermicides, lubricants, soaps, tampons, vaginal sprays, or douches.  Always practice safe sex. Talk with your health care provider about which form of birth control (contraception) is best for you.  Maintain open communication with your sexual partner. General instructions  Take over-the-counter and prescription medicines only as told by your health care provider.  If you had tests done, it is your responsibility to get your tests results. Ask your health care provider or the department performing the test when your results will be ready.  Urinate before you engage in sexual activity.  Consider joining a support group.  Keep all follow-up visits as told by your health care provider. This is important. Contact a health care provider if:  You develop vaginal bleeding after sexual intercourse.  You develop a lump at the opening of your vagina. Seek medical care even if the lump is painless.  You have: ? Abnormal vaginal discharge. ? Vaginal dryness. ? Itchiness or irritation of your vulva or vagina. ? A new rash. ? Symptoms that get worse or do not improve with treatment. ? A fever. ? Pain when you urinate. ? Blood in your urine. Get help right away if:  You develop severe pain in your abdomen during or shortly after sexual intercourse.  You pass out after having sexual intercourse. This information is not intended to replace advice given to you by your health care provider. Make sure you discuss any questions you have with your health care provider. Document Released: 05/18/2007 Document Revised: 09/07/2015 Document  Reviewed: 11/28/2014 Elsevier Interactive Patient Education  2018 [employer]  

## 2017-01-02 NOTE — Progress Notes (Signed)
Subjective:     Patient ID: Teresa Williamson, female   DOB: 1965-10-30, 51 y.o.   MRN: [id number]  HPI Teresa Williamson is a 51 year old white female, married, sp hysterectomy for bleeding issues 17 years ago, in complaining of pain with sex, for over a year, worse lately.  Review of Systems Pain with sex for over a year, worse in last several months Urinary frequency at times No history of sexual violence Had urethra surgery as child   Reviewed past medical,surgical, social and family history. Reviewed medications and allergies.     Objective:   Physical Exam BP 122/72 (BP Location: Left Arm, Patient Position: Sitting, Cuff Size: Normal)   Pulse 66   Ht 5\' 3"  (1.6 m)   Wt 185 lb (83.9 kg)   BMI 32.77 kg/m  Skin warm and dry.Pelvic: external genitalia is normal in appearance no lesions, vagina: white discharge without odor, tender in vagina,urethra has no lesions or masses noted, but tender, cervix and uterus are absent,adnexa: no masses or tenderness noted. Bladder is mildly tender and no masses felt. Wet prep: +WBCs. PHQ 2 score 0.    Discussed trying uribel to see if helps and if not will try premarin vaginal cream next.  Assessment:     1. Dyspareunia in female       Plan:     Rx uribel #42 1 tid for 2 weeks with 1 refill Have sex day or 2 before appt Follow up in 2 weeks  Review handout on dyspareunia

## 2017-01-16 ENCOUNTER — Ambulatory Visit: Payer: BLUE CROSS/BLUE SHIELD | Admitting: Adult Health

## 2017-01-19 ENCOUNTER — Ambulatory Visit (INDEPENDENT_AMBULATORY_CARE_PROVIDER_SITE_OTHER): Payer: BLUE CROSS/BLUE SHIELD | Admitting: Adult Health

## 2017-01-19 ENCOUNTER — Encounter: Payer: Self-pay | Admitting: Adult Health

## 2017-01-19 ENCOUNTER — Telehealth: Payer: Self-pay | Admitting: *Deleted

## 2017-01-19 VITALS — BP 116/70 | HR 98 | Ht 63.0 in | Wt 184.0 lb

## 2017-01-19 DIAGNOSIS — N941 Unspecified dyspareunia: Secondary | ICD-10-CM | POA: Diagnosis not present

## 2017-01-19 MED ORDER — ESTROGENS, CONJUGATED 0.625 MG/GM VA CREA
TOPICAL_CREAM | VAGINAL | 12 refills | Status: DC
Start: 2017-01-19 — End: 2017-01-28

## 2017-01-19 NOTE — Progress Notes (Signed)
Subjective:     Patient ID: Teresa Williamson, female   DOB: May 06, 1966, 51 y.o.   MRN: [id number]  HPI Teresa Williamson is a 51 year old white female, married, back in follow up of trying uribel and it did not help at all, has pain with sex.   Review of Systems Pain with sex Reviewed past medical,surgical, social and family history. Reviewed medications and allergies.     Objective:   Physical Exam BP 116/70 (BP Location: Left Arm, Patient Position: Sitting, Cuff Size: Normal)   Pulse 98   Ht 5\' 3"  (1.6 m)   Wt 184 lb (83.5 kg)   BMI 32.59 kg/m   Talk only.Will try premarin vaginal cream 1 gm in vagina at hs for 4 weeks then 2-3 x weekly, then see me before having sex, to assess.     Assessment:     1. Dyspareunia in female       Plan:     Meds ordered this encounter  Medications  . conjugated estrogens (PREMARIN) vaginal cream    Sig: Use 1 gm in vagina at has for 4 weeks then 2-3 x weekly    Dispense:  42.5 g    Refill:  12    Order Specific Question:   Supervising Provider    Answer:   Florian Buff [[id number]]     Given 8 gms lot [id number] exp 6/19 Follow up in 4 weeks

## 2017-01-19 NOTE — Telephone Encounter (Signed)
Spoke with pt advising to call back next week for Premarin samples. If we have some, will be glad to give her some. Pt voiced understanding. [place]

## 2017-01-19 NOTE — Patient Instructions (Signed)
Use 1 gm Premarin cream in vagina for 1 month, then 2-3 x weekly No sex then F/U with me in 4 weeks

## 2017-01-27 ENCOUNTER — Telehealth: Payer: Self-pay | Admitting: *Deleted

## 2017-01-27 NOTE — Telephone Encounter (Signed)
Pt called stating that she would like something different sent to her pharmacy because the premarin is too expensive. Offered to give her samples and she states that she cant keep coming to [facility] for this. Advised pt that I would speak with Anderson Malta regarding this and we would call her back.  Called pt back to inform her that there is nothing cheaper and to advise her to speak with her pharmacy to see if estrace would be cheaper but she did not answer and her voicemail is full

## 2017-01-28 ENCOUNTER — Telehealth: Payer: Self-pay | Admitting: Women's Health

## 2017-01-28 MED ORDER — ESTRADIOL 0.1 MG/GM VA CREA: TOPICAL_CREAM | VAGINAL | 3 refills | Status: DC

## 2017-01-28 NOTE — Telephone Encounter (Signed)
Will rx estrace cream since cheaper

## 2017-01-28 NOTE — Telephone Encounter (Signed)
Pt says creams too expensive, offered her samples, can call in am

## 2017-01-29 ENCOUNTER — Telehealth: Payer: Self-pay | Admitting: *Deleted

## 2017-01-29 NOTE — Telephone Encounter (Signed)
Patient made aware samples at front desk.

## 2017-02-16 ENCOUNTER — Ambulatory Visit: Payer: BLUE CROSS/BLUE SHIELD | Admitting: Women's Health

## 2017-05-18 ENCOUNTER — Ambulatory Visit (INDEPENDENT_AMBULATORY_CARE_PROVIDER_SITE_OTHER): Payer: Managed Care, Other (non HMO) | Admitting: Otolaryngology

## 2017-06-29 ENCOUNTER — Ambulatory Visit (INDEPENDENT_AMBULATORY_CARE_PROVIDER_SITE_OTHER): Payer: Self-pay | Admitting: Otolaryngology

## 2017-12-14 ENCOUNTER — Ambulatory Visit (INDEPENDENT_AMBULATORY_CARE_PROVIDER_SITE_OTHER): Payer: Self-pay | Admitting: Otolaryngology

## 2017-12-14 DIAGNOSIS — F17210 Nicotine dependence, cigarettes, uncomplicated: Secondary | ICD-10-CM

## 2017-12-14 DIAGNOSIS — J382 Nodules of vocal cords: Secondary | ICD-10-CM

## 2017-12-14 DIAGNOSIS — K219 Gastro-esophageal reflux disease without esophagitis: Secondary | ICD-10-CM

## 2017-12-14 DIAGNOSIS — R490 Dysphonia: Secondary | ICD-10-CM

## 2018-05-07 ENCOUNTER — Encounter: Payer: Self-pay | Admitting: Emergency Medicine

## 2018-05-07 DIAGNOSIS — F411 Generalized anxiety disorder: Secondary | ICD-10-CM

## 2018-05-07 DIAGNOSIS — F4001 Agoraphobia with panic disorder: Secondary | ICD-10-CM | POA: Insufficient documentation

## 2018-05-28 ENCOUNTER — Ambulatory Visit: Payer: Self-pay | Admitting: Psychiatry

## 2018-07-02 ENCOUNTER — Ambulatory Visit: Payer: Self-pay | Admitting: Psychiatry

## 2018-07-12 ENCOUNTER — Ambulatory Visit (INDEPENDENT_AMBULATORY_CARE_PROVIDER_SITE_OTHER): Payer: Self-pay | Admitting: Otolaryngology

## 2018-08-03 ENCOUNTER — Other Ambulatory Visit: Payer: Self-pay | Admitting: Psychiatry

## 2018-08-03 MED ORDER — ARIPIPRAZOLE 20 MG PO TABS: 20.0000 mg | ORAL_TABLET | Freq: Every day | ORAL | 0 refills | Status: DC

## 2018-08-06 ENCOUNTER — Ambulatory Visit: Payer: Self-pay | Admitting: Psychiatry

## 2018-08-24 ENCOUNTER — Other Ambulatory Visit: Payer: Self-pay

## 2018-08-24 MED ORDER — LAMOTRIGINE 200 MG PO TABS: 200.0000 mg | ORAL_TABLET | Freq: Every day | ORAL | 2 refills | Status: DC

## 2018-10-14 ENCOUNTER — Other Ambulatory Visit: Payer: Self-pay | Admitting: Psychiatry

## 2018-12-17 ENCOUNTER — Encounter: Payer: Self-pay | Admitting: Psychiatry

## 2018-12-17 ENCOUNTER — Ambulatory Visit (INDEPENDENT_AMBULATORY_CARE_PROVIDER_SITE_OTHER): Payer: Self-pay | Admitting: Psychiatry

## 2018-12-17 ENCOUNTER — Other Ambulatory Visit: Payer: Self-pay

## 2018-12-17 DIAGNOSIS — F1911 Other psychoactive substance abuse, in remission: Secondary | ICD-10-CM

## 2018-12-17 DIAGNOSIS — F411 Generalized anxiety disorder: Secondary | ICD-10-CM

## 2018-12-17 DIAGNOSIS — F319 Bipolar disorder, unspecified: Secondary | ICD-10-CM

## 2018-12-17 DIAGNOSIS — F4001 Agoraphobia with panic disorder: Secondary | ICD-10-CM

## 2018-12-17 MED ORDER — ARIPIPRAZOLE 20 MG PO TABS: 20.0000 mg | ORAL_TABLET | Freq: Every day | ORAL | 3 refills | Status: DC

## 2018-12-17 MED ORDER — LAMOTRIGINE 200 MG PO TABS: 200.0000 mg | ORAL_TABLET | Freq: Every day | ORAL | 3 refills | Status: DC

## 2018-12-17 NOTE — Progress Notes (Signed)
Teresa Williamson [id number] February 17, 1966 53 y.o.  Subjective:   Patient ID:  Teresa Williamson is a 53 y.o. (DOB 16-Oct-1965) female.  Chief Complaint:  Chief Complaint  Patient presents with  . Anxiety  . Follow-up    Psychiatric med management    HPI Teresa Williamson presents to the office today for follow-up of bipolar 2 disorder with rapid cycling generalized anxiety disorder panic disorder with agoraphobia, driving phobia, remote history of drug and alcohol dependence including benzodiazepines.  Last seen December 11, 2017.  Stress sister in Hospice for 10 days.  Sleep good until this happened.  Overall in the last several months doing reasonably well and feels OK overall.  No panic generally.  Patient reports stable mood and denies depressed or irritable moods.  Chronic anxiety no worse.  Patient denies difficulty with sleep initiation or maintenance usually until her sister went into hospice.  She does not want medications for sleep right now because she wants to be able to get up for her sister as needed.. Denies appetite disturbance.  Patient reports that energy and motivation have been good.  Patient denies any difficulty with concentration.  Patient denies any suicidal ideation.  Still sober from drugs and alcohol.  She is taking tramadol for pain but denies abuse.  Prior history of benzodiazepine abuse.  Doesn't want med changes.  Past Psychiatric Medication Trials: Various SSRIs including Lexapro, citalopram, sertraline, venlafaxine, mirtazapine, Wellbutrin.  Geodon, lamotrigine, risperidone, Abilify, buspirone, history of abuse of Xanax and Klonopin, Depakote worsening depression, Ambien amnesia, hydroxyzine, trazodone, lamotrigine, clonidine, gabapentin SE, propranolol, topiramate, carbamazepine, Seroquel sedation, doxepin? taken  Review of Systems:  Review of Systems  Musculoskeletal: Positive for back pain.  Neurological: Positive for headaches. Negative for tremors and  weakness.  increase in frequent HA and thinks it's stress.  Medications: I have reviewed the patient's current medications.  Current Outpatient Medications  Medication Sig Dispense Refill  . ARIPiprazole (ABILIFY) 20 MG tablet Take 1 tablet (20 mg total) by mouth daily. 90 tablet 3  . baclofen (LIORESAL) 10 MG tablet Take 10 mg by mouth 3 (three) times daily as needed for muscle spasms.    Marland Kitchen lamoTRIgine (LAMICTAL) 200 MG tablet Take 1 tablet (200 mg total) by mouth daily. 90 tablet 3  . traMADol (ULTRAM) 50 MG tablet TAKE TWO TABLETS BY MOUTH THREE TIMES DAILY AS NEEDED FOR PAIN.  2  . Meth-Hyo-M Bl-Na Phos-Ph Sal (URIBEL) 118 MG CAPS Take 1 tid for 2 weeks (Patient not taking: Reported on 12/17/2018) 42 capsule 1  . propranolol (INDERAL) 20 MG tablet Take 20 mg by mouth.     No current facility-administered medications for this visit.     Medication Side Effects: None  Allergies:  Allergies  Allergen Reactions  . Other Other (See Comments)    Inflammatory med-unsure of name; stomach pain, vomiting, diarrhea    Past Medical History:  Diagnosis Date  . Anxiety   . Bipolar disorder ([place])   . Depression     Family History  Problem Relation Age of Onset  . Cancer Paternal Grandfather        lung  . Heart disease Maternal Grandmother   . Cancer Maternal Grandfather        prostate  . Other Mother        poor circulation in legs; tumor in intestines; was removed  . Thyroid disease Mother   . Breast cancer Sister     Social History   Socioeconomic History  .  Marital status: Married    Spouse name: Not on file  . Number of children: Not on file  . Years of education: Not on file  . Highest education level: Not on file  Occupational History  . Not on file  Social Needs  . Financial resource strain: Not on file  . Food insecurity    Worry: Not on file    Inability: Not on file  . Transportation needs    Medical: Not on file    Non-medical: Not on file  Tobacco Use   . Smoking status: Current Every Day Smoker    Packs/day: 1.00    Years: 35.00    Pack years: 35.00    Types: Cigarettes  . Smokeless tobacco: Never Used  Substance and Sexual Activity  . Alcohol use: No  . Drug use: No  . Sexual activity: Not Currently    Birth control/protection: Surgical    Comment: hyst  Lifestyle  . Physical activity    Days per week: Not on file    Minutes per session: Not on file  . Stress: Not on file  Relationships  . Social Herbalist on phone: Not on file    Gets together: Not on file    Attends religious service: Not on file    Active member of club or organization: Not on file    Attends meetings of clubs or organizations: Not on file    Relationship status: Not on file  . Intimate partner violence    Fear of current or ex partner: Not on file    Emotionally abused: Not on file    Physically abused: Not on file    Forced sexual activity: Not on file  Other Topics Concern  . Not on file  Social History Narrative  . Not on file    Past Medical History, Surgical history, Social history, and Family history were reviewed and updated as appropriate.   Please see review of systems for further details on the patient's review from today.   Objective:   Physical Exam:  There were no vitals taken for this visit.  Physical Exam Constitutional:      General: She is not in acute distress.    Appearance: She is well-developed.  Musculoskeletal:        General: No deformity.  Neurological:     Mental Status: She is alert and oriented to person, place, and time.     Coordination: Coordination normal.  Psychiatric:        Attention and Perception: Attention and perception normal. She does not perceive auditory or visual hallucinations.        Mood and Affect: Mood is anxious. Mood is not depressed. Affect is not labile, blunt, angry or inappropriate.        Speech: Speech normal.        Behavior: Behavior normal.        Thought  Content: Thought content normal. Thought content is not paranoid or delusional. Thought content does not include homicidal or suicidal ideation. Thought content does not include homicidal or suicidal plan.        Cognition and Memory: Cognition and memory normal.        Judgment: Judgment normal.     Comments: Insight intact     Lab Review:     Component Value Date/Time   NA 140 09/18/2006 2146   K 4.3 09/18/2006 2146   CL 107 09/18/2006 2146   CO2 21 09/18/2006  2146   GLUCOSE 100 (H) 09/18/2006 2146   BUN 7 09/18/2006 2146   CREATININE 0.65 09/18/2006 2146   CALCIUM 9.2 09/18/2006 2146   PROT 7.3 09/18/2006 2146   ALBUMIN 4.7 09/18/2006 2146   AST 19 09/18/2006 2146   ALT 22 09/18/2006 2146   ALKPHOS 61 09/18/2006 2146   BILITOT 0.3 09/18/2006 2146       Component Value Date/Time   WBC 12.7 (H) 09/18/2006 2146   RBC 4.38 09/18/2006 2146   HGB 14.7 POINT OF CARE RESULT 10/07/2007 0709   HCT 42.3 09/18/2006 2146   PLT 414 (H) 09/18/2006 2146   MCV 96.6 09/18/2006 2146   MCHC 33.6 09/18/2006 2146   RDW 13.5 09/18/2006 2146   LYMPHSABS 2.0 09/18/2006 2146   MONOABS 0.5 09/18/2006 2146   EOSABS 0.0 09/18/2006 2146   BASOSABS 0.0 09/18/2006 2146    No results found for: POCLITH, LITHIUM   No results found for: PHENYTOIN, PHENOBARB, VALPROATE, CBMZ   .res Assessment: Plan:    Teresa Williamson was seen today for anxiety and follow-up.  Diagnoses and all orders for this visit:  Bipolar I disorder with rapid cycling (HCC) -     ARIPiprazole (ABILIFY) 20 MG tablet; Take 1 tablet (20 mg total) by mouth daily. -     lamoTRIgine (LAMICTAL) 200 MG tablet; Take 1 tablet (200 mg total) by mouth daily.  Panic disorder with agoraphobia -     ARIPiprazole (ABILIFY) 20 MG tablet; Take 1 tablet (20 mg total) by mouth daily.  Generalized anxiety disorder -     ARIPiprazole (ABILIFY) 20 MG tablet; Take 1 tablet (20 mg total) by mouth daily.  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  She also has a long history of not keeping appointments as recommended.  She has chronic persistent anxiety unresponsive to a   variety of treatments including various medications noted above plus psychotherapy.  as noted she has tried multiple different medications.  Supportive therapy therapy dealing with her grief over her sister and hospice.  Discussed management of anxiety and tendency towards chronic insomnia.  Overall bipolar symptoms have been relatively stable with the use of Abilify 20 mg daily plus lamotrigine 200 mg daily.  No medication changes.  Discussed potential metabolic side effects associated with atypical antipsychotics, as well as potential risk for movement side effects. Advised pt to contact office if movement side effects occur.   Because she has been relatively stable with regard to bipolar disorder she wants to keep her appointment spread out.  She has a phobia of driving and is difficult to get here.  Discussed the risk of spreading out appointments too far.  She accepts that risk and wants to wait a year before follow-up and she did the last time.  Follow-up 1 year  Lynder Parents MD, DFAPA Please see After Visit Summary for patient specific instructions.  No future appointments.  No orders of the defined types were placed in this encounter.   -------------------------------

## 2019-04-26 ENCOUNTER — Other Ambulatory Visit (HOSPITAL_COMMUNITY): Payer: Self-pay | Admitting: Physician Assistant

## 2019-04-26 DIAGNOSIS — Z1231 Encounter for screening mammogram for malignant neoplasm of breast: Secondary | ICD-10-CM

## 2019-04-28 ENCOUNTER — Encounter (HOSPITAL_COMMUNITY): Payer: Self-pay

## 2019-04-28 ENCOUNTER — Ambulatory Visit (HOSPITAL_COMMUNITY)
Admission: RE | Admit: 2019-04-28 | Discharge: 2019-04-28 | Disposition: A | Discharge status: Home / Self Care | Payer: Self-pay | Source: Ambulatory Visit | Attending: Physician Assistant | Admitting: Physician Assistant

## 2019-04-28 ENCOUNTER — Other Ambulatory Visit: Payer: Self-pay

## 2019-04-28 DIAGNOSIS — Z1231 Encounter for screening mammogram for malignant neoplasm of breast: Secondary | ICD-10-CM | POA: Insufficient documentation

## 2019-09-26 DIAGNOSIS — Z0000 Encounter for general adult medical examination without abnormal findings: Secondary | ICD-10-CM | POA: Diagnosis not present

## 2019-10-01 DIAGNOSIS — D72829 Elevated white blood cell count, unspecified: Secondary | ICD-10-CM | POA: Diagnosis not present

## 2019-10-01 DIAGNOSIS — F17200 Nicotine dependence, unspecified, uncomplicated: Secondary | ICD-10-CM | POA: Diagnosis not present

## 2019-10-01 DIAGNOSIS — F418 Other specified anxiety disorders: Secondary | ICD-10-CM | POA: Diagnosis not present

## 2019-10-01 DIAGNOSIS — F3178 Bipolar disorder, in full remission, most recent episode mixed: Secondary | ICD-10-CM | POA: Diagnosis not present

## 2019-10-01 DIAGNOSIS — R5383 Other fatigue: Secondary | ICD-10-CM | POA: Diagnosis not present

## 2019-10-04 DIAGNOSIS — M25571 Pain in right ankle and joints of right foot: Secondary | ICD-10-CM | POA: Diagnosis not present

## 2019-10-14 DIAGNOSIS — M79671 Pain in right foot: Secondary | ICD-10-CM | POA: Diagnosis not present

## 2020-01-13 ENCOUNTER — Other Ambulatory Visit: Payer: Self-pay | Admitting: Psychiatry

## 2020-01-13 DIAGNOSIS — F411 Generalized anxiety disorder: Secondary | ICD-10-CM

## 2020-01-13 DIAGNOSIS — F319 Bipolar disorder, unspecified: Secondary | ICD-10-CM

## 2020-01-13 DIAGNOSIS — F4001 Agoraphobia with panic disorder: Secondary | ICD-10-CM

## 2020-01-13 NOTE — Telephone Encounter (Signed)
Call rS

## 2020-01-18 ENCOUNTER — Other Ambulatory Visit: Payer: Self-pay | Admitting: Psychiatry

## 2020-01-18 DIAGNOSIS — F4001 Agoraphobia with panic disorder: Secondary | ICD-10-CM

## 2020-01-18 DIAGNOSIS — F319 Bipolar disorder, unspecified: Secondary | ICD-10-CM

## 2020-01-18 DIAGNOSIS — F411 Generalized anxiety disorder: Secondary | ICD-10-CM

## 2020-03-12 ENCOUNTER — Encounter: Payer: Self-pay | Admitting: Psychiatry

## 2020-03-12 ENCOUNTER — Ambulatory Visit (INDEPENDENT_AMBULATORY_CARE_PROVIDER_SITE_OTHER): Payer: BC Managed Care – PPO | Admitting: Psychiatry

## 2020-03-12 DIAGNOSIS — F4001 Agoraphobia with panic disorder: Secondary | ICD-10-CM

## 2020-03-12 DIAGNOSIS — F319 Bipolar disorder, unspecified: Secondary | ICD-10-CM | POA: Diagnosis not present

## 2020-03-12 DIAGNOSIS — F411 Generalized anxiety disorder: Secondary | ICD-10-CM | POA: Diagnosis not present

## 2020-03-12 DIAGNOSIS — F1911 Other psychoactive substance abuse, in remission: Secondary | ICD-10-CM | POA: Diagnosis not present

## 2020-03-12 MED ORDER — LAMOTRIGINE 200 MG PO TABS: 200.0000 mg | ORAL_TABLET | Freq: Every day | ORAL | 3 refills | Status: AC

## 2020-03-12 MED ORDER — ARIPIPRAZOLE 20 MG PO TABS: 20.0000 mg | ORAL_TABLET | Freq: Every day | ORAL | 3 refills | Status: DC

## 2020-03-12 NOTE — Progress Notes (Signed)
Teresa Williamson [id number] 1965/09/12 54 y.o.  Video Visit via My Chart  I connected with pt by My Chart and verified that I am speaking with the correct person using two identifiers.   I discussed the limitations, risks, security and privacy concerns of performing an evaluation and management service by My Chart  and the availability of in person appointments. I also discussed with the patient that there may be a patient responsible charge related to this service. The patient expressed understanding and agreed to proceed.  I discussed the assessment and treatment plan with the patient. The patient was provided an opportunity to ask questions and all were answered. The patient agreed with the plan and demonstrated an understanding of the instructions.   The patient was advised to call back or seek an in-person evaluation if the symptoms worsen or if the condition fails to improve as anticipated.  I provided 30 minutes of video time during this encounter.  The patient was located at home and the provider was located office. Started 130 ended 200  Subjective:   Patient ID:  Teresa Williamson is a 54 y.o. (DOB 1965/06/14) female.  Chief Complaint:  Chief Complaint  Patient presents with  . Follow-up    mood and meds  . Anxiety    HPI RAMANDA PAULES presents to the office today for follow-up of bipolar 2 disorder with rapid cycling generalized anxiety disorder panic disorder with agoraphobia, driving phobia, remote history of drug and alcohol dependence including benzodiazepines.  Last seen December 17, 2018. No med changes.  03/12/20 appt with following noted: Doing real good.  Patient reports stable mood and denies depressed or irritable moods.  Patient denies any recent difficulty with anxiety.  Patient denies difficulty with sleep initiation or maintenance. Denies appetite disturbance.  Patient reports that energy and motivation have been good.  Patient denies any difficulty with  concentration.  Patient denies any suicidal ideation. No sig mood swings.  Still dealing with grief of sister and inlaws. Stayed on Abilify 20, lamotrigine 200.  No propranolol, Not much change with it. SE none.  Still sober from drugs and alcohol.  She is taking tramadol for pain but denies abuse.  Prior history of benzodiazepine abuse.  Doesn't want med changes.  Past Psychiatric Medication Trials: Various SSRIs including Lexapro, citalopram, sertraline, venlafaxine, mirtazapine, Wellbutrin.  Geodon, lamotrigine, risperidone, Abilify, buspirone, history of abuse of Xanax and Klonopin, Depakote worsening depression, Ambien amnesia, hydroxyzine, trazodone, lamotrigine, clonidine, gabapentin SE, propranolol, topiramate, carbamazepine, Seroquel sedation, doxepin? taken  Review of Systems:  Review of Systems  Musculoskeletal: Positive for back pain.  Neurological: Positive for headaches. Negative for tremors and weakness.  increase in frequent HA and thinks it's stress.  Medications: I have reviewed the patient's current medications.  Current Outpatient Medications  Medication Sig Dispense Refill  . ARIPiprazole (ABILIFY) 20 MG tablet Take 1 tablet (20 mg total) by mouth daily. 90 tablet 3  . baclofen (LIORESAL) 10 MG tablet Take 10 mg by mouth 3 (three) times daily as needed for muscle spasms.    Marland Kitchen lamoTRIgine (LAMICTAL) 200 MG tablet Take 1 tablet (200 mg total) by mouth daily. 90 tablet 3  . Meth-Hyo-M Bl-Na Phos-Ph Sal (URIBEL) 118 MG CAPS Take 1 tid for 2 weeks 42 capsule 1  . traMADol (ULTRAM) 50 MG tablet TAKE TWO TABLETS BY MOUTH THREE TIMES DAILY AS NEEDED FOR PAIN.  2  . propranolol (INDERAL) 20 MG tablet Take 20 mg by mouth. (Patient not  taking: Reported on 03/12/2020)     No current facility-administered medications for this visit.    Medication Side Effects: None  Allergies:  Allergies  Allergen Reactions  . Other Other (See Comments)    Inflammatory med-unsure of name;  stomach pain, vomiting, diarrhea    Past Medical History:  Diagnosis Date  . Anxiety   . Bipolar disorder ([place])   . Depression     Family History  Problem Relation Age of Onset  . Cancer Paternal Grandfather        lung  . Heart disease Maternal Grandmother   . Cancer Maternal Grandfather        prostate  . Other Mother        poor circulation in legs; tumor in intestines; was removed  . Thyroid disease Mother   . Breast cancer Sister     Social History   Socioeconomic History  . Marital status: Married    Spouse name: Not on file  . Number of children: Not on file  . Years of education: Not on file  . Highest education level: Not on file  Occupational History  . Not on file  Tobacco Use  . Smoking status: Current Every Day Smoker    Packs/day: 1.00    Years: 35.00    Pack years: 35.00    Types: Cigarettes  . Smokeless tobacco: Never Used  Substance and Sexual Activity  . Alcohol use: No  . Drug use: No  . Sexual activity: Not Currently    Birth control/protection: Surgical    Comment: hyst  Other Topics Concern  . Not on file  Social History Narrative  . Not on file   Social Determinants of Health   Financial Resource Strain:   . Difficulty of Paying Living Expenses: Not on file  Food Insecurity:   . Worried About Charity fundraiser in the Last Year: Not on file  . Ran Out of Food in the Last Year: Not on file  Transportation Needs:   . Lack of Transportation (Medical): Not on file  . Lack of Transportation (Non-Medical): Not on file  Physical Activity:   . Days of Exercise per Week: Not on file  . Minutes of Exercise per Session: Not on file  Stress:   . Feeling of Stress : Not on file  Social Connections:   . Frequency of Communication with Friends and Family: Not on file  . Frequency of Social Gatherings with Friends and Family: Not on file  . Attends Religious Services: Not on file  . Active Member of Clubs or Organizations: Not on file  .  Attends Archivist Meetings: Not on file  . Marital Status: Not on file  Intimate Partner Violence:   . Fear of Current or Ex-Partner: Not on file  . Emotionally Abused: Not on file  . Physically Abused: Not on file  . Sexually Abused: Not on file    Past Medical History, Surgical history, Social history, and Family history were reviewed and updated as appropriate.   Please see review of systems for further details on the patient's review from today.   Objective:   Physical Exam:  There were no vitals taken for this visit.  Physical Exam Constitutional:      General: She is not in acute distress.    Appearance: She is well-developed.  Musculoskeletal:        General: No deformity.  Neurological:     Mental Status: She is alert and  oriented to person, place, and time.     Coordination: Coordination normal.  Psychiatric:        Attention and Perception: Attention and perception normal. She does not perceive auditory or visual hallucinations.        Mood and Affect: Mood is anxious. Mood is not depressed. Affect is not labile, blunt, angry or inappropriate.        Speech: Speech normal.        Behavior: Behavior normal.        Thought Content: Thought content normal. Thought content is not paranoid or delusional. Thought content does not include homicidal or suicidal ideation. Thought content does not include homicidal or suicidal plan.        Cognition and Memory: Cognition and memory normal.        Judgment: Judgment normal.     Comments: Insight intact     Lab Review:     Component Value Date/Time   NA 140 09/18/2006 2146   K 4.3 09/18/2006 2146   CL 107 09/18/2006 2146   CO2 21 09/18/2006 2146   GLUCOSE 100 (H) 09/18/2006 2146   BUN 7 09/18/2006 2146   CREATININE 0.65 09/18/2006 2146   CALCIUM 9.2 09/18/2006 2146   PROT 7.3 09/18/2006 2146   ALBUMIN 4.7 09/18/2006 2146   AST 19 09/18/2006 2146   ALT 22 09/18/2006 2146   ALKPHOS 61 09/18/2006 2146    BILITOT 0.3 09/18/2006 2146       Component Value Date/Time   WBC 12.7 (H) 09/18/2006 2146   RBC 4.38 09/18/2006 2146   HGB 14.7 POINT OF CARE RESULT 10/07/2007 0709   HCT 42.3 09/18/2006 2146   PLT 414 (H) 09/18/2006 2146   MCV 96.6 09/18/2006 2146   MCHC 33.6 09/18/2006 2146   RDW 13.5 09/18/2006 2146   LYMPHSABS 2.0 09/18/2006 2146   MONOABS 0.5 09/18/2006 2146   EOSABS 0.0 09/18/2006 2146   BASOSABS 0.0 09/18/2006 2146    No results found for: POCLITH, LITHIUM   No results found for: PHENYTOIN, PHENOBARB, VALPROATE, CBMZ   .res Assessment: Plan:    Genna was seen today for follow-up and anxiety.  Diagnoses and all orders for this visit:  Bipolar I disorder with rapid cycling (HCC) -     ARIPiprazole (ABILIFY) 20 MG tablet; Take 1 tablet (20 mg total) by mouth daily. -     lamoTRIgine (LAMICTAL) 200 MG tablet; Take 1 tablet (200 mg total) by mouth daily.  Panic disorder with agoraphobia -     ARIPiprazole (ABILIFY) 20 MG tablet; Take 1 tablet (20 mg total) by mouth daily.  Generalized anxiety disorder -     ARIPiprazole (ABILIFY) 20 MG tablet; Take 1 tablet (20 mg total) by mouth daily.  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  She also has a long history of not keeping appointments as recommended.  She has chronic persistent anxiety unresponsive to a   variety of treatments including various medications noted above plus psychotherapy.  as noted she has tried multiple different medications.  Supportive therapy therapy dealing with her grief over her sister and hospice.  Discussed management of anxiety and tendency towards chronic insomnia.  Overall bipolar symptoms have been relatively stable with the use of Abilify 20 mg daily plus lamotrigine 200 mg daily.  No medication changes.  Discussed potential metabolic side effects associated with atypical antipsychotics, as well as potential risk for movement side effects.  Advised pt  to contact office if movement side effects occur.   Because she has been relatively stable with regard to bipolar disorder she wants to keep her appointment spread out.  She has a phobia of driving and is difficult to get here.  Discussed the risk of spreading out appointments too far.  She accepts that risk and wants to wait a year before follow-up and she did the last time.  Follow-up 1 year  Lynder Parents MD, DFAPA Please see After Visit Summary for patient specific instructions.  No future appointments.  No orders of the defined types were placed in this encounter.   -------------------------------

## 2020-06-07 ENCOUNTER — Other Ambulatory Visit (HOSPITAL_COMMUNITY): Payer: Self-pay | Admitting: Family Medicine

## 2020-06-07 DIAGNOSIS — Z1231 Encounter for screening mammogram for malignant neoplasm of breast: Secondary | ICD-10-CM

## 2020-06-21 ENCOUNTER — Ambulatory Visit (HOSPITAL_COMMUNITY): Payer: Self-pay

## 2020-07-05 ENCOUNTER — Ambulatory Visit (HOSPITAL_COMMUNITY): Payer: Self-pay

## 2020-11-15 ENCOUNTER — Telehealth (INDEPENDENT_AMBULATORY_CARE_PROVIDER_SITE_OTHER): Payer: Self-pay

## 2020-11-15 NOTE — Telephone Encounter (Signed)
Patient called and stated that she made a MyChart appointment to see Dr. Anastasio Champion on 01/10/2021. Patient stated that she was referred by Criselda Peaches and is requesting to be a patient of Dr. Anastasio Champion for hormone therapy. Patient stated that she really wants to see Dr. Anastasio Champion.  Please advise if this patient can be accepted as a new patient.

## 2020-11-15 NOTE — Telephone Encounter (Signed)
Called patient and gave her the message and she was fine to wait to see what Dr Anastasio Champion said and is very hopeful that he will accept her as a new patient. Patient verbalized an understanding.

## 2020-11-20 ENCOUNTER — Other Ambulatory Visit (INDEPENDENT_AMBULATORY_CARE_PROVIDER_SITE_OTHER): Payer: Self-pay | Admitting: Internal Medicine

## 2020-11-20 NOTE — Telephone Encounter (Signed)
Please let the patient know that because she is taking tramadol on a daily basis, I cannot accept her into the practice.  If she can come off the tramadol, I can reconsider.

## 2021-01-10 ENCOUNTER — Ambulatory Visit (INDEPENDENT_AMBULATORY_CARE_PROVIDER_SITE_OTHER): Payer: Self-pay | Admitting: Internal Medicine

## 2021-03-28 ENCOUNTER — Ambulatory Visit: Payer: BC Managed Care – PPO | Admitting: Psychiatry

## 2021-05-13 ENCOUNTER — Other Ambulatory Visit: Payer: Self-pay | Admitting: Psychiatry

## 2021-05-13 DIAGNOSIS — F319 Bipolar disorder, unspecified: Secondary | ICD-10-CM

## 2021-05-13 DIAGNOSIS — F411 Generalized anxiety disorder: Secondary | ICD-10-CM

## 2021-05-13 DIAGNOSIS — F4001 Agoraphobia with panic disorder: Secondary | ICD-10-CM

## 2021-05-14 NOTE — Telephone Encounter (Signed)
90 day ok?

## 2021-05-23 ENCOUNTER — Ambulatory Visit: Payer: BC Managed Care – PPO | Admitting: Psychiatry

## 2021-09-05 ENCOUNTER — Ambulatory Visit: Payer: BC Managed Care – PPO | Admitting: Psychiatry

## 2021-11-04 ENCOUNTER — Encounter: Payer: Self-pay | Admitting: *Deleted

## 2021-11-28 ENCOUNTER — Ambulatory Visit (INDEPENDENT_AMBULATORY_CARE_PROVIDER_SITE_OTHER): Payer: BC Managed Care – PPO | Admitting: Orthopaedic Surgery

## 2021-11-28 ENCOUNTER — Ambulatory Visit (INDEPENDENT_AMBULATORY_CARE_PROVIDER_SITE_OTHER): Payer: BC Managed Care – PPO

## 2021-11-28 ENCOUNTER — Encounter: Payer: Self-pay | Admitting: Orthopaedic Surgery

## 2021-11-28 VITALS — BP 137/84 | HR 103 | Ht 63.0 in | Wt 233.0 lb

## 2021-11-28 DIAGNOSIS — G8929 Other chronic pain: Secondary | ICD-10-CM | POA: Diagnosis not present

## 2021-11-28 DIAGNOSIS — M25562 Pain in left knee: Secondary | ICD-10-CM

## 2021-11-28 DIAGNOSIS — M25571 Pain in right ankle and joints of right foot: Secondary | ICD-10-CM

## 2021-11-28 NOTE — Patient Instructions (Signed)
While we are working on your approval for MRI please go ahead and call to schedule your appointment with [facility] within at least one (1) week.   Central Scheduling [phone]  

## 2021-11-28 NOTE — Progress Notes (Signed)
Subjective:    Patient ID: Teresa Williamson, female    DOB: 08/14/1965, 56 y.o.   MRN: [id number]  HPI She has pain of the left knee with swelling, popping and some giving way at times.  This has been present for some time now.  She has had arthroscopy on the right knee in the past and feels she may have a similar problem with the left knee now.  She has not improved with rest, ice, heat, Tylenol.    She had surgery about two years ago on the right ankle and foot and has limited motion of the ankle with a right sided limp. She feels this has contributed to the left knee pain.  She has no trauma, no redness and no numbness of the left knee.   Review of Systems  Constitutional:  Positive for activity change.  Musculoskeletal:  Positive for arthralgias, gait problem and joint swelling.  All other systems reviewed and are negative. For Review of Systems, all other systems reviewed and are negative.  The following is a summary of the past history medically, past history surgically, known current medicines, social history and family history.  This information is gathered electronically by the computer from prior information and documentation.  I review this each visit and have found including this information at this point in the chart is beneficial and informative.   Past Medical History:  Diagnosis Date   Anxiety    Bipolar disorder ([place])    Depression     Past Surgical History:  Procedure Laterality Date   bladder reflux     age 47   CESAREAN SECTION     x 3   KNEE SURGERY Right    VAGINAL HYSTERECTOMY      Current Outpatient Medications on File Prior to Visit  Medication Sig Dispense Refill   ARIPiprazole (ABILIFY) 20 MG tablet TAKE 1 TABLET BY MOUTH DAILY 90 tablet 0   baclofen (LIORESAL) 10 MG tablet Take 10 mg by mouth 3 (three) times daily as needed for muscle spasms.     lamoTRIgine (LAMICTAL) 200 MG tablet Take 1 tablet (200 mg total) by mouth daily. 90 tablet 3    Meth-Hyo-M Bl-Na Phos-Ph Sal (URIBEL) 118 MG CAPS Take 1 tid for 2 weeks 42 capsule 1   propranolol (INDERAL) 20 MG tablet Take 20 mg by mouth.     traMADol (ULTRAM) 50 MG tablet TAKE TWO TABLETS BY MOUTH THREE TIMES DAILY AS NEEDED FOR PAIN.  2   No current facility-administered medications on file prior to visit.    Social History   Socioeconomic History   Marital status: Married    Spouse name: Not on file   Number of children: Not on file   Years of education: Not on file   Highest education level: Not on file  Occupational History   Not on file  Tobacco Use   Smoking status: Every Day    Packs/day: 1.00    Years: 35.00    Total pack years: 35.00    Types: Cigarettes   Smokeless tobacco: Never  Substance and Sexual Activity   Alcohol use: No   Drug use: No   Sexual activity: Not Currently    Birth control/protection: Surgical    Comment: hyst  Other Topics Concern   Not on file  Social History Narrative   Not on file   Social Determinants of Health   Financial Resource Strain: Not on file  Food Insecurity: Not on file  Transportation Needs: Not on file  Physical Activity: Not on file  Stress: Not on file  Social Connections: Not on file  Intimate Partner Violence: Not on file    Family History  Problem Relation Age of Onset   Cancer Paternal Grandfather        lung   Heart disease Maternal Grandmother    Cancer Maternal Grandfather        prostate   Other Mother        poor circulation in legs; tumor in intestines; was removed   Thyroid disease Mother    Breast cancer Sister     BP 137/84   Pulse (!) 103   Ht '5\' 3"'$  (1.6 m)   Wt 233 lb (105.7 kg)   BMI 41.27 kg/m   Body mass index is 41.27 kg/m.      Objective:   Physical Exam Vitals and nursing note reviewed. Exam conducted with a chaperone present.  Constitutional:      Appearance: She is well-developed.  HENT:     Head: Normocephalic and atraumatic.  Eyes:     Conjunctiva/sclera:  Conjunctivae normal.     Pupils: Pupils are equal, round, and reactive to light.  Cardiovascular:     Rate and Rhythm: Normal rate and regular rhythm.  Pulmonary:     Effort: Pulmonary effort is normal.  Abdominal:     Palpations: Abdomen is soft.  Musculoskeletal:     Cervical back: Normal range of motion and neck supple.       Legs:  Skin:    General: Skin is warm and dry.  Neurological:     Mental Status: She is alert and oriented to person, place, and time.     Cranial Nerves: No cranial nerve deficit.     Motor: No abnormal muscle tone.     Coordination: Coordination normal.     Deep Tendon Reflexes: Reflexes are normal and symmetric. Reflexes normal.  Psychiatric:        Behavior: Behavior normal.        Thought Content: Thought content normal.        Judgment: Judgment normal.   X-rays were done of the right knee, reported separately.        Assessment & Plan:   Encounter Diagnoses  Name Primary?   Chronic pain of left knee Yes   Pain in right ankle and joints of right foot    I am concerned about meniscus tear of the left knee.  She has not improved with conservative treatment.  She may need arthroscopy.  I will get MRI of the left knee.  I can review her medical records about the right ankle/foot surgery and problem.  Return in two weeks.  Call if any problem.  Precautions discussed.  Electronically Signed Sanjuana Kava, MD 7/20/202310:26 AM

## 2021-12-12 ENCOUNTER — Ambulatory Visit (HOSPITAL_COMMUNITY)
Admission: RE | Admit: 2021-12-12 | Discharge: 2021-12-12 | Disposition: A | Discharge status: Home / Self Care | Payer: BC Managed Care – PPO | Source: Ambulatory Visit | Attending: Orthopaedic Surgery | Admitting: Orthopaedic Surgery

## 2021-12-12 DIAGNOSIS — M25562 Pain in left knee: Secondary | ICD-10-CM | POA: Insufficient documentation

## 2021-12-12 DIAGNOSIS — G8929 Other chronic pain: Secondary | ICD-10-CM | POA: Insufficient documentation

## 2021-12-17 ENCOUNTER — Encounter: Payer: Self-pay | Admitting: Orthopaedic Surgery

## 2021-12-17 ENCOUNTER — Ambulatory Visit (INDEPENDENT_AMBULATORY_CARE_PROVIDER_SITE_OTHER): Payer: BC Managed Care – PPO

## 2021-12-17 ENCOUNTER — Ambulatory Visit: Payer: BC Managed Care – PPO | Admitting: Orthopaedic Surgery

## 2021-12-17 DIAGNOSIS — S83242D Other tear of medial meniscus, current injury, left knee, subsequent encounter: Secondary | ICD-10-CM | POA: Diagnosis not present

## 2021-12-17 DIAGNOSIS — M25571 Pain in right ankle and joints of right foot: Secondary | ICD-10-CM

## 2021-12-17 NOTE — Patient Instructions (Signed)
Schedule consult appt w/ Dr.Harrison for LT knee scope

## 2021-12-17 NOTE — Progress Notes (Signed)
My knee is tender still.  She had MRI of the left knee which showed: IMPRESSION: 1. Medial meniscal tear. 2. Mild patellofemoral osteoarthritis. 3. Moderate-sized joint effusion. 4. Mild prepatellar subcutaneous edema and fluid.   I have explained the findings to her.  I have recommended arthroscopy of the knee.  I have shown her a knee model.  She is agreeable to this. I will have her see Dr. Aline Brochure.  She also has another problem. She had surgery on the right heel area in 2021 with a percutaneous tendo Achilles lengthening, medial displacement calcaneal slide osteotomy, posterior tibial tendon debridement, talonavicular joint arthrotomy with synovectomy, calcaneonavicular ligament reconstruction and flexor digitorum longus deep tendon transfer by Dr. 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  She has continued pain of the right heel and foot.  She is not doing well, having pain after walking a distance or standing a while.  She has no new trauma.  Left knee has pain medially, ROM 0 to 110, crepitus, slight effusion, limp left, positive medial [place], NV intact.  No distal edema.  Right foot: old scars, no redness, NV intact.  Tender posterior heel.  Slight limp to the right.  Encounter Diagnoses  Name Primary?   Pain in right ankle and joints of right foot Yes   Other tear of medial meniscus, current injury, left knee, subsequent encounter    X-rays were done of the right foot, reported separately.  I have recommended she see Dr. Aline Brochure for the knee.  I have recommended she see one of the local medical schools for the heel and foot problem, [facility], [facility] or [facility].  She will think about it.  Call if any problem.  Precautions discussed.  Electronically Signed Teresa Kava, MD 8/8/20233:52 PM

## 2021-12-18 ENCOUNTER — Ambulatory Visit: Payer: BC Managed Care – PPO | Admitting: Orthopaedic Surgery

## 2022-01-08 ENCOUNTER — Encounter: Payer: Self-pay | Admitting: Orthopedic Surgery

## 2022-01-08 ENCOUNTER — Ambulatory Visit: Payer: BC Managed Care – PPO | Admitting: Orthopedic Surgery

## 2022-01-08 VITALS — BP 127/76 | HR 100 | Ht 63.0 in | Wt 233.0 lb

## 2022-01-08 DIAGNOSIS — Z01818 Encounter for other preprocedural examination: Secondary | ICD-10-CM | POA: Diagnosis not present

## 2022-01-08 DIAGNOSIS — S83242D Other tear of medial meniscus, current injury, left knee, subsequent encounter: Secondary | ICD-10-CM | POA: Diagnosis not present

## 2022-01-08 NOTE — Progress Notes (Signed)
Chief Complaint  Patient presents with   Knee Pain    Left/ discuss surgery    Encounter Diagnosis  Name Primary?   Other tear of medial meniscus, current injury, left knee, subsequent encounter Yes    Dr. Luna Glasgow referred this patient for possible surgery left knee  Atraumatic pain left knee positive for medial meniscus tear  Patient's primary complaint is pain medial side left knee denies mechanical symptoms  Exam findings positive for medial joint line.  She does hyperextend normally flexion is normal.  Meniscal signs are.  Intraoperatively my findings are : MRI shows a torn medial meniscus   Plan surgical arthroscopy left knee partial medial meniscectomy  The procedure has been fully reviewed with the patient; The risks and benefits of surgery have been discussed and explained and understood. Alternative treatment has also been reviewed, questions were encouraged and answered. The postoperative plan is also been reviewed.   CLINICAL DATA:  Left knee pain for 2 months   EXAM: MRI OF THE LEFT KNEE WITHOUT CONTRAST   TECHNIQUE: Multiplanar, multisequence MR imaging of the knee was performed. No intravenous contrast was administered.   COMPARISON:  X-ray 11/28/2021   FINDINGS: MENISCI   Medial meniscus: Focal oblique tear centered at the medial meniscal posterior horn-body junction.   Lateral meniscus:  Intact.   LIGAMENTS   Cruciates: Intact ACL and PCL.   Collaterals: Intact MCL. Lateral collateral ligament complex intact.   CARTILAGE   Patellofemoral: Partial-thickness chondral fissure of the lateral patellar facet near the patellar apex. Mild chondral surface irregularity within the medial trochlea inferiorly.   Medial:  No chondral defect.   Lateral:  No chondral defect.   MISCELLANEOUS   Joint: Moderate sized joint effusion. Fat pads within normal limits.   Popliteal Fossa:  No Baker's cyst. Intact popliteus tendon.   Extensor Mechanism:  Intact  quadriceps and patellar tendons.   Bones: No acute fracture. No dislocation. No bone marrow edema. No marrow replacing bone lesion.   Other: Mild prepatellar subcutaneous edema and fluid.   IMPRESSION: 1. Medial meniscal tear. 2. Mild patellofemoral osteoarthritis. 3. Moderate-sized joint effusion. 4. Mild prepatellar subcutaneous edema and fluid.     Electronically Signed   By: Davina Poke D.O.   On: 12/12/2021 14:02

## 2022-01-08 NOTE — Patient Instructions (Addendum)
Your surgery will be at [facility] by Dr Harrison  The hospital will contact you with a preoperative appointment to discuss Anesthesia.  Please arrive on time or 15 minutes early for the preoperative appointment, they have a very tight schedule if you are late or do not come in your surgery will be cancelled.  The phone number is [phone]. Please bring your medications with you for the appointment. They will tell you the arrival time and medication instructions when you have your preoperative evaluation. Do not wear nail polish the day of your surgery and if you take Phentermine you need to stop this medication ONE WEEK prior to your surgery. If you take Invokana, Farxiga, Jardiance, or Steglatro) - Hold 72 hours before the procedure.  If you take Ozempic,  Bydureon or Trulicity do not take for 8 days before your surgery. If you take Victoza, Rybelsis, Saxenda or Adlyxi stop 24 hours before the procedure.  Please arrive at the hospital 2 hours before procedure if scheduled at 9:30 or later in the day or at the time the nurse tells you at your preoperative visit.   If you have my chart do not use the time given in my chart use the time given to you by the nurse during your preoperative visit.   Your surgery  time may change. Please be available for phone calls the day of your surgery and the day before. The Short Stay department may need to discuss changes about your surgery time. Not reaching the you could lead to procedure delays and possible cancellation.  You must have a ride home and someone to stay with you for 24 to 48 hours. The person taking you home will receive and sign for the your discharge instructions.  Please be prepared to give your support person's name and telephone number to [facility]. Dr Harrison will need that name and phone number post procedure.    Meniscus Injury, Arthroscopy   Arthroscopy is a surgical procedure that involves the use of a small scope that  has a camera and surgical instruments on the end (arthroscope). An arthroscope can be used to repair your meniscus injury.  LET YOUR HEALTH CARE PROVIDER KNOW ABOUT: Any allergies you have. All medicines you are taking, including vitamins, herbs, eyedrops, creams, and over-the-counter medicines. Any recent colds or infections you have had or currently have. Previous problems you or members of your family have had with the use of anesthetics. Any blood disorders or blood clotting problems you have. Previous surgeries you have had. Medical conditions you have. RISKS AND COMPLICATIONS Generally, this is a safe procedure. However, as with any procedure, problems can occur. Possible problems include: Damage to nerves or blood vessels. Excess bleeding. Blood clots. Infection. BEFORE THE PROCEDURE Do not eat or drink for 6-8 hours before the procedure. Take medicines as directed by your surgeon. Ask your surgeon about changing or stopping your regular medicines. You may have lab tests the morning of surgery. PROCEDURE  You will be given one of the following:  A medicine that numbs the area (local anesthesia). A medicine that makes you go to sleep (general anesthesia). A medicine injected into your spine that numbs your body below the waist (spinal anesthesia). Most often, several small cuts (incisions) are made in the knee. The arthroscope and instruments go into the incisions to repair the damage. The torn portion of the meniscus is removed.   AFTER THE PROCEDURE You will be taken to the recovery   area where your progress will be monitored. When you are awake, stable, and taking fluids without complications, you will be allowed to go home. This is usually the same day. A torn or stretched ligament (ligament sprain) may take 6-8 weeks to heal.  It takes about the 4-6 WEEKS if your surgeon removed a torn meniscus. A repaired meniscus may require 6-12 weeks of recovery time. A torn ligament  needing reconstructive surgery may take 6-12 months to heal fully.   This information is not intended to replace advice given to you by your health care provider. Make sure you discuss any questions you have with your health care provider. You have decided to proceed with operative arthroscopy of the knee. You have decided not to continue with nonoperative measures such as but not limited to oral medication, weight loss, activity modification, physical therapy, bracing, or injection.  We will perform operative arthroscopy of the knee. Some of the risks associated with arthroscopic surgery of the knee include but are not limited to Bleeding Infection Swelling Stiffness Blood clot Pain Need for knee replacement surgery    In compliance with recent [place] law in federal regulation regarding opioid use and abuse and addiction, we will taper (stop) opioid medication after 2 weeks.  If you're not comfortable with these risks and would like to continue with nonoperative treatment please let Dr. Harrison know prior to your surgery.  

## 2022-01-08 NOTE — Addendum Note (Signed)
Addended byCandice Camp on: 01/08/2022 09:02 AM   Modules accepted: Orders

## 2022-01-09 ENCOUNTER — Telehealth: Payer: Self-pay | Admitting: Orthopedic Surgery

## 2022-01-09 NOTE — Telephone Encounter (Signed)
Spoke to her Messaged Teresa Williamson/ done

## 2022-01-09 NOTE — Telephone Encounter (Signed)
Patient called to request a sooner date for her surgery - requests 02/11/22 rather than 02/18/22; states needs to let employer know as soon as possible. Please advise - said okay to leave her a voice message at [phone]

## 2022-01-21 ENCOUNTER — Other Ambulatory Visit: Payer: Self-pay | Admitting: Orthopedic Surgery

## 2022-01-21 DIAGNOSIS — S83242D Other tear of medial meniscus, current injury, left knee, subsequent encounter: Secondary | ICD-10-CM

## 2022-02-05 NOTE — Patient Instructions (Signed)
Teresa Williamson  02/05/2022     '@PREFPERIOPPHARMACY'$ @   Your procedure is scheduled on 02/11/22.  Report to [facility]  Call this number if you have problems the morning of surgery:  [phone]   Remember:  Do not eat or drink after midnight.    Take these medicines the morning of surgery with A SIP OF WATER abilify, lamictal & ultram if needed    Do not wear jewelry, make-up or nail polish.  Do not wear lotions, powders, or perfumes, or deodorant.  Do not shave 48 hours prior to surgery.  Men may shave face and neck.  Do not bring valuables to the hospital.  [facility] is not responsible for any belongings or valuables.  Contacts, dentures or bridgework may not be worn into surgery.  Leave your suitcase in the car.  After surgery it may be brought to your room.  For patients admitted to the hospital, discharge time will be determined by your treatment team.  Patients discharged the day of surgery will not be allowed to drive home.   Name and phone number of your driver:   family Special instructions:    Please read over the following fact sheets that you were given. Coughing and Deep Breathing, Surgical Site Infection Prevention, Anesthesia Post-op Instructions, and Care and Recovery After Surgery      PATIENT INSTRUCTIONS POST-ANESTHESIA  IMMEDIATELY FOLLOWING SURGERY:  Do not drive or operate machinery for the first twenty four hours after surgery.  Do not make any important decisions for twenty four hours after surgery or while taking narcotic pain medications or sedatives.  If you develop intractable nausea and vomiting or a severe headache please notify your doctor immediately.  FOLLOW-UP:  Please make an appointment with your surgeon as instructed. You do not need to follow up with anesthesia unless specifically instructed to do so.  WOUND CARE INSTRUCTIONS (if applicable):  Keep a dry clean dressing on the anesthesia/puncture wound site if there  is drainage.  Once the wound has quit draining you may leave it open to air.  Generally you should leave the bandage intact for twenty four hours unless there is drainage.  If the epidural site drains for more than 36-48 hours please call the anesthesia department.  QUESTIONS?:  Please feel free to call your physician or the hospital operator if you have any questions, and they will be happy to assist you.      How to Use Chlorhexidine Before Surgery Chlorhexidine gluconate (CHG) is a germ-killing (antiseptic) solution that is used to clean the skin. It can get rid of the bacteria that normally live on the skin and can keep them away for about 24 hours. To clean your skin with CHG, you may be given: A CHG solution to use in the shower or as part of a sponge bath. A prepackaged cloth that contains CHG. Cleaning your skin with CHG may help lower the risk for infection: While you are staying in the intensive care unit of the hospital. If you have a vascular access, such as a central line, to provide short-term or long-term access to your veins. If you have a catheter to drain urine from your bladder. If you are on a ventilator. A ventilator is a machine that helps you breathe by moving air in and out of your lungs. After surgery. What are the risks? Risks of using CHG include: A skin reaction. Hearing loss, if CHG gets in your ears  and you have a perforated eardrum. Eye injury, if CHG gets in your eyes and is not rinsed out. The CHG product catching fire. Make sure that you avoid smoking and flames after applying CHG to your skin. Do not use CHG: If you have a chlorhexidine allergy or have previously reacted to chlorhexidine. On babies younger than 69 months of age. How to use CHG solution Use CHG only as told by your health care provider, and follow the instructions on the label. Use the full amount of CHG as directed. Usually, this is one bottle. During a shower Follow these steps when  using CHG solution during a shower (unless your health care provider gives you different instructions): Start the shower. Use your normal soap and shampoo to wash your face and hair. Turn off the shower or move out of the shower stream. Pour the CHG onto a clean washcloth. Do not use any type of brush or rough-edged sponge. Starting at your neck, lather your body down to your toes. Make sure you follow these instructions: If you will be having surgery, pay special attention to the part of your body where you will be having surgery. Scrub this area for at least 1 minute. Do not use CHG on your head or face. If the solution gets into your ears or eyes, rinse them well with water. Avoid your genital area. Avoid any areas of skin that have broken skin, cuts, or scrapes. Scrub your back and under your arms. Make sure to wash skin folds. Let the lather sit on your skin for 1-2 minutes or as long as told by your health care provider. Thoroughly rinse your entire body in the shower. Make sure that all body creases and crevices are rinsed well. Dry off with a clean towel. Do not put any substances on your body afterward--such as powder, lotion, or perfume--unless you are told to do so by your health care provider. Only use lotions that are recommended by the manufacturer. Put on clean clothes or pajamas. If it is the night before your surgery, sleep in clean sheets.  During a sponge bath Follow these steps when using CHG solution during a sponge bath (unless your health care provider gives you different instructions): Use your normal soap and shampoo to wash your face and hair. Pour the CHG onto a clean washcloth. Starting at your neck, lather your body down to your toes. Make sure you follow these instructions: If you will be having surgery, pay special attention to the part of your body where you will be having surgery. Scrub this area for at least 1 minute. Do not use CHG on your head or face. If  the solution gets into your ears or eyes, rinse them well with water. Avoid your genital area. Avoid any areas of skin that have broken skin, cuts, or scrapes. Scrub your back and under your arms. Make sure to wash skin folds. Let the lather sit on your skin for 1-2 minutes or as long as told by your health care provider. Using a different clean, wet washcloth, thoroughly rinse your entire body. Make sure that all body creases and crevices are rinsed well. Dry off with a clean towel. Do not put any substances on your body afterward--such as powder, lotion, or perfume--unless you are told to do so by your health care provider. Only use lotions that are recommended by the manufacturer. Put on clean clothes or pajamas. If it is the night before your surgery, sleep in clean  sheets. How to use CHG prepackaged cloths Only use CHG cloths as told by your health care provider, and follow the instructions on the label. Use the CHG cloth on clean, dry skin. Do not use the CHG cloth on your head or face unless your health care provider tells you to. When washing with the CHG cloth: Avoid your genital area. Avoid any areas of skin that have broken skin, cuts, or scrapes. Before surgery Follow these steps when using a CHG cloth to clean before surgery (unless your health care provider gives you different instructions): Using the CHG cloth, vigorously scrub the part of your body where you will be having surgery. Scrub using a back-and-forth motion for 3 minutes. The area on your body should be completely wet with CHG when you are done scrubbing. Do not rinse. Discard the cloth and let the area air-dry. Do not put any substances on the area afterward, such as powder, lotion, or perfume. Put on clean clothes or pajamas. If it is the night before your surgery, sleep in clean sheets.  For general bathing Follow these steps when using CHG cloths for general bathing (unless your health care provider gives you  different instructions). Use a separate CHG cloth for each area of your body. Make sure you wash between any folds of skin and between your fingers and toes. Wash your body in the following order, switching to a new cloth after each step: The front of your neck, shoulders, and chest. Both of your arms, under your arms, and your hands. Your stomach and groin area, avoiding the genitals. Your right leg and foot. Your left leg and foot. The back of your neck, your back, and your buttocks. Do not rinse. Discard the cloth and let the area air-dry. Do not put any substances on your body afterward--such as powder, lotion, or perfume--unless you are told to do so by your health care provider. Only use lotions that are recommended by the manufacturer. Put on clean clothes or pajamas. Contact a health care provider if: Your skin gets irritated after scrubbing. You have questions about using your solution or cloth. You swallow any chlorhexidine. Call your local poison control center (1-907 273 7492 in the [place] Get help right away if: Your eyes itch badly, or they become very red or swollen. Your skin itches badly and is red or swollen. Your hearing changes. You have trouble seeing. You have swelling or tingling in your mouth or throat. You have trouble breathing. These symptoms may represent a serious problem that is an emergency. Do not wait to see if the symptoms will go away. Get medical help right away. Call your local emergency services (911 in the [place] Do not drive yourself to the hospital. Summary Chlorhexidine gluconate (CHG) is a germ-killing (antiseptic) solution that is used to clean the skin. Cleaning your skin with CHG may help to lower your risk for infection. You may be given CHG to use for bathing. It may be in a bottle or in a prepackaged cloth to use on your skin. Carefully follow your health care provider's instructions and the instructions on the product label. Do not use CHG if  you have a chlorhexidine allergy. Contact your health care provider if your skin gets irritated after scrubbing. This information is not intended to replace advice given to you by your health care provider. Make sure you discuss any questions you have with your health care provider. Document Revised: 08/26/2021 Document Reviewed: 07/09/2020 Elsevier Patient Education  2023 [employer] Knee Arthroscopy Knee arthroscopy is a surgery to examine the inside of the knee joint and repair any damage to cartilage, surfaces, and other soft tissues around the joint. You may have this surgery if nonsurgical treatment has not relieved your symptoms. Knee arthroscopy may be used to: Repair a torn ligament or other torn tissues. Ligaments are tissues that connect bones to each other. Remove bone fragments. Remove a fluid-filled sac (cyst). Treat kneecap (patella)problems. Treat septic knee. This is an advanced infection in the knee. Arthroscopic surgery is done using a thin tube that has a light and camera on the end of it (arthroscope). The arthroscope is placed through a small incision, and the camera sends images to a screen in the operating room. The images are used to help perform the surgery. Tell a health care provider about: Any allergies you have. All medicines you are taking, including vitamins, herbs, eye drops, creams, and over-the-counter medicines. Any problems you or family members have had with anesthetic medicines. Any blood disorders you have. Any surgeries you have had. Any medical conditions you have. Whether you are pregnant or may be pregnant. What are the risks? Generally, this is a safe procedure. However, problems may occur, including: Infection. Bleeding. Allergic reactions to medicines. Damage to blood vessels, nerves, or tissues in the knee. A blood clot that forms in the leg and travels to the lung (pulmonary embolism). Failure of the surgery to relieve symptoms. Knee  stiffness. What happens before the procedure? Staying hydrated Follow instructions from your health care provider about hydration, which may include: Up to 2 hours before the procedure - you may continue to drink clear liquids, such as water, clear fruit juice, black coffee, and plain tea.  Eating and drinking restrictions Follow instructions from your health care provider about eating and drinking, which may include: 8 hours before the procedure - stop eating heavy meals or foods, such as meat, fried foods, or fatty foods. 6 hours before the procedure - stop eating light meals or foods, such as toast or cereal. 6 hours before the procedure - stop drinking milk or drinks that contain milk. 2 hours before the procedure - stop drinking clear liquids. Medicines Ask your health care provider about: Changing or stopping your regular medicines. This is especially important if you are taking diabetes medicines or blood thinners. Taking medicines such as aspirin and ibuprofen. These medicines can thin your blood. Do not take these medicines unless your health care provider tells you to take them. Taking over-the-counter medicines, vitamins, herbs, and supplements. General instructions You may have a physical exam and tests, such as an X-ray, CT scan, or MRI. Do not drink alcohol unless your health care provider says that you can. Do not use any products that contain nicotine or tobacco for at least 4 weeks before the procedure. These products include cigarettes, e-cigarettes, and chewing tobacco. If you need help quitting, ask your health care provider. Plan to have a responsible adult take you home from the hospital or clinic. If you will be going home right after the procedure, plan to have a responsible adult care for you for the time you are told. This is important. Ask your health care provider: How your surgery site will be marked. What steps will be taken to help prevent infection. These  steps may include: Removing hair at the surgery site. Washing skin with a germ-killing soap. Taking antibiotic medicine. What happens during the procedure?  An IV will be inserted into one  of your veins. You will be given one or more of the following: A medicine to help you relax (sedative). A medicine to numb the knee area (local anesthetic). A medicine to make you fall asleep (general anesthetic). A medicine that is injected into an area of your body to numb everything below the injection site (regional anesthetic). This may be injected into your groin or thigh. A cuff may be placed around your upper leg to slow blood flow to your lower leg during the procedure. Several small incisions will be made around your knee. Your knee joint will be rinsed (flushed) and filled with sterile saline. This is a germ-free solution made of salt and water. This expands the area to help your surgeon see your joint more clearly. An arthroscope will be passed through one of your incisions, into your knee joint. Other surgical instruments will be passed through the other incisions. Then, your surgeon will examine and repair your knee as needed. The sterile saline will be drained from your knee, and the cuff will be removed from your upper leg. Your incisions will be closed with adhesive strips or stitches, also called sutures, and covered with a bandage (dressing). The procedure may vary among health care providers and hospitals. What happens after the procedure?  Your blood pressure, heart rate, breathing rate, and blood oxygen level will be monitored until you leave the hospital or clinic. You will be given pain medicine as needed. You may be given medicine to lower your risk of blood clots. You may have to wear compression stockings. These stockings help to prevent blood clots and reduce swelling in your legs. You may be given a knee brace or immobilizer. Do not drive or use machinery until your health  care provider approves. Summary Knee arthroscopy is a surgery to examine or repair the inside of your knee joint. Before the procedure, follow instructions from your health care provider about eating and drinking. Plan to have a responsible adult take you home from the hospital or clinic. This information is not intended to replace advice given to you by your health care provider. Make sure you discuss any questions you have with your health care provider. Document Revised: 08/29/2019 Document Reviewed: 08/29/2019 Elsevier Patient Education  [employer].

## 2022-02-07 ENCOUNTER — Encounter (HOSPITAL_COMMUNITY): Payer: Self-pay

## 2022-02-07 ENCOUNTER — Encounter (HOSPITAL_COMMUNITY)
Admission: RE | Admit: 2022-02-07 | Discharge: 2022-02-07 | Disposition: A | Discharge status: Home / Self Care | Payer: BC Managed Care – PPO | Source: Ambulatory Visit | Attending: Orthopedic Surgery | Admitting: Orthopedic Surgery

## 2022-02-07 DIAGNOSIS — Z01812 Encounter for preprocedural laboratory examination: Secondary | ICD-10-CM | POA: Diagnosis not present

## 2022-02-07 DIAGNOSIS — S83242D Other tear of medial meniscus, current injury, left knee, subsequent encounter: Secondary | ICD-10-CM | POA: Diagnosis not present

## 2022-02-07 DIAGNOSIS — Z01818 Encounter for other preprocedural examination: Secondary | ICD-10-CM

## 2022-02-07 DIAGNOSIS — X58XXXD Exposure to other specified factors, subsequent encounter: Secondary | ICD-10-CM | POA: Diagnosis not present

## 2022-02-07 HISTORY — DX: Chronic obstructive pulmonary disease, unspecified: J44.9

## 2022-02-07 LAB — CBC WITH DIFFERENTIAL/PLATELET
Abs Immature Granulocytes: 0.05 10*3/uL (ref 0.00–0.07)
Basophils Absolute: 0 10*3/uL (ref 0.0–0.1)
Basophils Relative: 0 %
Eosinophils Absolute: 0.1 10*3/uL (ref 0.0–0.5)
Eosinophils Relative: 1 %
HCT: 40.3 % (ref 36.0–46.0)
Hemoglobin: 12.8 g/dL (ref 12.0–15.0)
Immature Granulocytes: 1 %
Lymphocytes Relative: 20 %
Lymphs Abs: 1.7 10*3/uL (ref 0.7–4.0)
MCH: 29.2 pg (ref 26.0–34.0)
MCHC: 31.8 g/dL (ref 30.0–36.0)
MCV: 92 fL (ref 80.0–100.0)
Monocytes Absolute: 0.4 10*3/uL (ref 0.1–1.0)
Monocytes Relative: 5 %
Neutro Abs: 6.4 10*3/uL (ref 1.7–7.7)
Neutrophils Relative %: 73 %
Platelets: 394 10*3/uL (ref 150–400)
RBC: 4.38 MIL/uL (ref 3.87–5.11)
RDW: 14.6 % (ref 11.5–15.5)
WBC: 8.7 10*3/uL (ref 4.0–10.5)
nRBC: 0 % (ref 0.0–0.2)

## 2022-02-07 LAB — BASIC METABOLIC PANEL
Anion gap: 8 (ref 5–15)
BUN: 8 mg/dL (ref 6–20)
CO2: 26 mmol/L (ref 22–32)
Calcium: 9.1 mg/dL (ref 8.9–10.3)
Chloride: 105 mmol/L (ref 98–111)
Creatinine, Ser: 0.75 mg/dL (ref 0.44–1.00)
GFR, Estimated: >60 mL/min (ref >60)
Glucose, Bld: 201 mg/dL — ABNORMAL HIGH (ref 70–99)
Potassium: 4 mmol/L (ref 3.5–5.1)
Sodium: 139 mmol/L (ref 135–145)

## 2022-02-10 NOTE — H&P (Signed)
Outpatient surgery H&P  Chief complaint left knee pain       Encounter Diagnosis  Name Primary?   Other tear of medial meniscus, current injury, left knee, subsequent encounter Yes      Teresa Williamson is 56 years old she was seen by Dr. Luna Glasgow she was treated nonoperatively for left knee pain she eventually had an MRI which showed she had a medial meniscus tear with no mechanical symptoms but pain on the medial side of the joint which interfered with her activities of daily living.  Because she did not improve with the nonoperative care she opted for surgical intervention with partial medial meniscectomy  Past Medical History:  Diagnosis Date   Anxiety    Bipolar disorder ([place])    COPD (chronic obstructive pulmonary disease) (HCC)    Depression    Past Surgical History:  Procedure Laterality Date   bladder reflux     age 58   CESAREAN SECTION     x 3   FOOT SURGERY Right    KNEE SURGERY Right    VAGINAL HYSTERECTOMY     Social History   Tobacco Use   Smoking status: Every Day    Packs/day: 1.00    Years: 35.00    Total pack years: 35.00    Types: Cigarettes   Smokeless tobacco: Never  Substance Use Topics   Alcohol use: No   Drug use: No   Family History  Problem Relation Age of Onset   Cancer Paternal Grandfather        lung   Heart disease Maternal Grandmother    Cancer Maternal Grandfather        prostate   Other Mother        poor circulation in legs; tumor in intestines; was removed   Thyroid disease Mother    Breast cancer Sister    Current Outpatient Medications  Medication Instructions   ARIPiprazole (ABILIFY) 20 MG tablet TAKE 1 TABLET BY MOUTH DAILY   lamoTRIgine (LAMICTAL) 200 mg, Oral, Daily   traMADol (ULTRAM) 50 MG tablet TAKE TWO TABLETS BY MOUTH THREE TIMES DAILY AS NEEDED FOR PAIN.   Allergies  Allergen Reactions   Other Other (See Comments)    Inflammatory med-unsure of name; stomach pain, vomiting, diarrhea    Vital signs are stable in the  office.  She was noted to have normal development nutrition grooming and hygiene body habitus BMI is 41 no deformities  She had good pulses in her skin was intact  She had normal gait and station.  She had tenderness over the medial joint line she had full range of motion her knee was stable meniscal signs were positive muscle strength and tone were normal  She was oriented x3 mood and affect were pleasant her sensation was intact she had no abnormal reflexes    Diagnosis torn medial meniscus   Plan surgical arthroscopy left knee partial medial meniscectomy   The procedure has been fully reviewed with the patient; The risks and benefits of surgery have been discussed and explained and understood. Alternative treatment has also been reviewed, questions were encouraged and answered. The postoperative plan is also been reviewed.     EXAM: MRI OF THE LEFT KNEE WITHOUT CONTRAST   TECHNIQUE: Multiplanar, multisequence MR imaging of the knee was performed. No intravenous contrast was administered.   COMPARISON:  X-ray 11/28/2021   FINDINGS: MENISCI   Medial meniscus: Focal oblique tear centered at the medial meniscal posterior horn-body junction.   Lateral  meniscus:  Intact.   LIGAMENTS   Cruciates: Intact ACL and PCL.   Collaterals: Intact MCL. Lateral collateral ligament complex intact.   CARTILAGE   Patellofemoral: Partial-thickness chondral fissure of the lateral patellar facet near the patellar apex. Mild chondral surface irregularity within the medial trochlea inferiorly.   Medial:  No chondral defect.   Lateral:  No chondral defect.   MISCELLANEOUS   Joint: Moderate sized joint effusion. Fat pads within normal limits.   Popliteal Fossa:  No Baker's cyst. Intact popliteus tendon.   Extensor Mechanism:  Intact quadriceps and patellar tendons.   Bones: No acute fracture. No dislocation. No bone marrow edema. No marrow replacing bone lesion.   Other: Mild  prepatellar subcutaneous edema and fluid.   IMPRESSION: 1. Medial meniscal tear. 2. Mild patellofemoral osteoarthritis. 3. Moderate-sized joint effusion. 4. Mild prepatellar subcutaneous edema and fluid.     Electronically Signed   By: Davina Poke D.O.   On: 12/12/2021 14:02

## 2022-02-11 ENCOUNTER — Encounter (HOSPITAL_COMMUNITY): Payer: Self-pay | Admitting: Orthopedic Surgery

## 2022-02-11 ENCOUNTER — Ambulatory Visit (HOSPITAL_COMMUNITY): Payer: BC Managed Care – PPO | Admitting: Anesthesiology

## 2022-02-11 ENCOUNTER — Other Ambulatory Visit: Payer: Self-pay

## 2022-02-11 ENCOUNTER — Ambulatory Visit (HOSPITAL_COMMUNITY)
Admission: RE | Admit: 2022-02-11 | Discharge: 2022-02-11 | Disposition: A | Discharge status: Home / Self Care | Payer: BC Managed Care – PPO | Attending: Orthopedic Surgery | Admitting: Orthopedic Surgery

## 2022-02-11 ENCOUNTER — Telehealth: Payer: Self-pay | Admitting: Radiology

## 2022-02-11 ENCOUNTER — Encounter (HOSPITAL_COMMUNITY)
Admission: RE | Disposition: A | Discharge status: Home / Self Care | Payer: Self-pay | Source: Home / Self Care | Attending: Orthopedic Surgery

## 2022-02-11 DIAGNOSIS — F319 Bipolar disorder, unspecified: Secondary | ICD-10-CM | POA: Diagnosis not present

## 2022-02-11 DIAGNOSIS — Z6841 Body Mass Index (BMI) 40.0 and over, adult: Secondary | ICD-10-CM | POA: Diagnosis not present

## 2022-02-11 DIAGNOSIS — M25462 Effusion, left knee: Secondary | ICD-10-CM | POA: Diagnosis not present

## 2022-02-11 DIAGNOSIS — F419 Anxiety disorder, unspecified: Secondary | ICD-10-CM | POA: Diagnosis not present

## 2022-02-11 DIAGNOSIS — S83242D Other tear of medial meniscus, current injury, left knee, subsequent encounter: Secondary | ICD-10-CM

## 2022-02-11 DIAGNOSIS — F17210 Nicotine dependence, cigarettes, uncomplicated: Secondary | ICD-10-CM | POA: Insufficient documentation

## 2022-02-11 DIAGNOSIS — X58XXXA Exposure to other specified factors, initial encounter: Secondary | ICD-10-CM | POA: Insufficient documentation

## 2022-02-11 DIAGNOSIS — J449 Chronic obstructive pulmonary disease, unspecified: Secondary | ICD-10-CM | POA: Diagnosis not present

## 2022-02-11 DIAGNOSIS — S83242A Other tear of medial meniscus, current injury, left knee, initial encounter: Secondary | ICD-10-CM | POA: Diagnosis present

## 2022-02-11 DIAGNOSIS — M1712 Unilateral primary osteoarthritis, left knee: Secondary | ICD-10-CM | POA: Insufficient documentation

## 2022-02-11 HISTORY — PX: KNEE ARTHROSCOPY WITH MEDIAL MENISECTOMY: SHX5651

## 2022-02-11 SURGERY — ARTHROSCOPY, KNEE, WITH MEDIAL MENISCECTOMY
Anesthesia: General | Site: Knee | Laterality: Left

## 2022-02-11 MED ORDER — HYDROCODONE-ACETAMINOPHEN 5-325 MG PO TABS: 1.0000 | ORAL_TABLET | ORAL | 0 refills | Status: DC | PRN

## 2022-02-11 MED ORDER — CHLORHEXIDINE GLUCONATE 0.12 % MT SOLN: 15.0000 mL | Freq: Once | OROMUCOSAL | Status: DC

## 2022-02-11 MED ORDER — MIDAZOLAM HCL 5 MG/5ML IJ SOLN
INTRAMUSCULAR | Status: DC | PRN
  Administered 2022-02-11: 2 mg via INTRAVENOUS

## 2022-02-11 MED ORDER — HYDROMORPHONE HCL 1 MG/ML IJ SOLN
0.2500 mg | INTRAMUSCULAR | Status: DC | PRN
  Administered 2022-02-11: 0.5 mg via INTRAVENOUS
  Filled 2022-02-11: qty 0.5

## 2022-02-11 MED ORDER — PROPOFOL 10 MG/ML IV BOLUS
INTRAVENOUS | Status: DC | PRN
  Administered 2022-02-11: 175 mg via INTRAVENOUS

## 2022-02-11 MED ORDER — SUGAMMADEX SODIUM 200 MG/2ML IV SOLN
INTRAVENOUS | Status: DC | PRN
  Administered 2022-02-11: 200 mg via INTRAVENOUS

## 2022-02-11 MED ORDER — ACETAMINOPHEN 500 MG PO TABS
500.0000 mg | ORAL_TABLET | Freq: Once | ORAL | Status: AC
  Administered 2022-02-11: 500 mg via ORAL
  Filled 2022-02-11: qty 1

## 2022-02-11 MED ORDER — MIDAZOLAM HCL 2 MG/2ML IJ SOLN
INTRAMUSCULAR | Status: AC
  Filled 2022-02-11: qty 2

## 2022-02-11 MED ORDER — BUPIVACAINE-EPINEPHRINE (PF) 0.5% -1:200000 IJ SOLN
INTRAMUSCULAR | Status: DC | PRN
  Administered 2022-02-11: 45 mL via PERINEURAL

## 2022-02-11 MED ORDER — ONDANSETRON HCL 4 MG/2ML IJ SOLN: 4.0000 mg | Freq: Once | INTRAMUSCULAR | Status: DC | PRN

## 2022-02-11 MED ORDER — MEPERIDINE HCL 50 MG/ML IJ SOLN: 6.2500 mg | INTRAMUSCULAR | Status: DC | PRN

## 2022-02-11 MED ORDER — FENTANYL CITRATE (PF) 100 MCG/2ML IJ SOLN
INTRAMUSCULAR | Status: AC
  Filled 2022-02-11: qty 2

## 2022-02-11 MED ORDER — CEFAZOLIN SODIUM-DEXTROSE 2-4 GM/100ML-% IV SOLN
2.0000 g | INTRAVENOUS | Status: AC
  Administered 2022-02-11: 2 g via INTRAVENOUS

## 2022-02-11 MED ORDER — HYDROCODONE-ACETAMINOPHEN 5-325 MG PO TABS
1.0000 | ORAL_TABLET | Freq: Once | ORAL | Status: AC
  Administered 2022-02-11: 1 via ORAL
  Filled 2022-02-11: qty 1

## 2022-02-11 MED ORDER — ROCURONIUM 10MG/ML (10ML) SYRINGE FOR MEDFUSION PUMP - OPTIME
INTRAVENOUS | Status: DC | PRN
  Administered 2022-02-11: 50 mg via INTRAVENOUS

## 2022-02-11 MED ORDER — BUPIVACAINE-EPINEPHRINE (PF) 0.5% -1:200000 IJ SOLN
INTRAMUSCULAR | Status: AC
  Filled 2022-02-11: qty 60

## 2022-02-11 MED ORDER — FENTANYL CITRATE (PF) 100 MCG/2ML IJ SOLN
INTRAMUSCULAR | Status: DC | PRN
  Administered 2022-02-11 (×2): 50 ug via INTRAVENOUS

## 2022-02-11 MED ORDER — CEFAZOLIN SODIUM-DEXTROSE 2-4 GM/100ML-% IV SOLN
INTRAVENOUS | Status: AC
  Filled 2022-02-11: qty 100

## 2022-02-11 MED ORDER — ONDANSETRON HCL 4 MG/2ML IJ SOLN
INTRAMUSCULAR | Status: DC | PRN
  Administered 2022-02-11: 4 mg via INTRAVENOUS

## 2022-02-11 MED ORDER — EPINEPHRINE PF 1 MG/ML IJ SOLN
INTRAMUSCULAR | Status: AC
  Filled 2022-02-11: qty 3

## 2022-02-11 MED ORDER — SODIUM CHLORIDE 0.9 % IR SOLN
Status: DC | PRN
  Administered 2022-02-11: 1000 mL

## 2022-02-11 MED ORDER — LACTATED RINGERS IV SOLN: INTRAVENOUS | Status: DC

## 2022-02-11 MED ORDER — ORAL CARE MOUTH RINSE: 15.0000 mL | Freq: Once | OROMUCOSAL | Status: DC

## 2022-02-11 MED ORDER — SODIUM CHLORIDE 0.9 % IR SOLN
Status: DC | PRN
  Administered 2022-02-11 (×2): 3000 mL

## 2022-02-11 MED ORDER — LIDOCAINE HCL (CARDIAC) PF 50 MG/5ML IV SOSY
PREFILLED_SYRINGE | INTRAVENOUS | Status: DC | PRN
  Administered 2022-02-11: 90 mg via INTRAVENOUS

## 2022-02-11 SURGICAL SUPPLY — 38 items
APL PRP STRL LF DISP 70% ISPRP (MISCELLANEOUS) ×1
BAG HAMPER (MISCELLANEOUS) ×1 IMPLANT
BNDG CMPR STD VLCR NS LF 5.8X6 (GAUZE/BANDAGES/DRESSINGS) ×1
BNDG ELASTIC 6X5.8 VLCR NS LF (GAUZE/BANDAGES/DRESSINGS) ×1 IMPLANT
CHLORAPREP W/TINT 26 (MISCELLANEOUS) ×1 IMPLANT
CLOTH BEACON ORANGE TIMEOUT ST (SAFETY) ×1 IMPLANT
COOLER ICEMAN CLASSIC (MISCELLANEOUS) ×1 IMPLANT
DISSECTOR 4.0MMX13CM CVD (MISCELLANEOUS) IMPLANT
GAUZE SPONGE 4X4 12PLY STRL (GAUZE/BANDAGES/DRESSINGS) ×1 IMPLANT
GAUZE XEROFORM 5X9 LF (GAUZE/BANDAGES/DRESSINGS) ×1 IMPLANT
GLOVE BIOGEL PI IND STRL 7.0 (GLOVE) ×2 IMPLANT
GLOVE SS N UNI LF 8.5 STRL (GLOVE) ×1 IMPLANT
GLOVE SURG POLYISO LF SZ8 (GLOVE) ×1 IMPLANT
GOWN STRL REUS W/TWL LRG LVL3 (GOWN DISPOSABLE) ×1 IMPLANT
GOWN STRL REUS W/TWL XL LVL3 (GOWN DISPOSABLE) ×1 IMPLANT
IV NS IRRIG 3000ML ARTHROMATIC (IV SOLUTION) ×2 IMPLANT
KIT BLADEGUARD II DBL (SET/KITS/TRAYS/PACK) ×1 IMPLANT
KIT TURNOVER CYSTO (KITS) ×1 IMPLANT
MANIFOLD NEPTUNE II (INSTRUMENTS) ×1 IMPLANT
MARKER SKIN DUAL TIP RULER LAB (MISCELLANEOUS) ×1 IMPLANT
NDL HYPO 18GX1.5 BLUNT FILL (NEEDLE) ×1 IMPLANT
NDL HYPO 21X1.5 SAFETY (NEEDLE) ×1 IMPLANT
NEEDLE HYPO 18GX1.5 BLUNT FILL (NEEDLE) ×1 IMPLANT
NEEDLE HYPO 21X1.5 SAFETY (NEEDLE) ×1 IMPLANT
NS IRRIG 1000ML POUR BTL (IV SOLUTION) ×1 IMPLANT
PACK ARTHRO LIMB DRAPE STRL (MISCELLANEOUS) ×1 IMPLANT
PACK ARTHROSCOPY WL (CUSTOM PROCEDURE TRAY) ×1 IMPLANT
PAD ABD 5X9 TENDERSORB (GAUZE/BANDAGES/DRESSINGS) ×1 IMPLANT
PAD ARMBOARD 7.5X6 YLW CONV (MISCELLANEOUS) ×1 IMPLANT
PAD COLD SHLDR SM WRAP-ON (PAD) IMPLANT
PAD FOR LEG HOLDER (MISCELLANEOUS) ×1 IMPLANT
PADDING CAST COTTON 6X4 STRL (CAST SUPPLIES) ×1 IMPLANT
SET ARTHROSCOPY INST (INSTRUMENTS) ×1 IMPLANT
SET BASIN LINEN APH (SET/KITS/TRAYS/PACK) ×1 IMPLANT
SUT ETHILON 3 0 FSL (SUTURE) ×1 IMPLANT
SYR 10ML LL (SYRINGE) ×1 IMPLANT
SYR 30ML LL (SYRINGE) ×2 IMPLANT
TUBING IN/OUT FLOW W/MAIN PUMP (TUBING) ×1 IMPLANT

## 2022-02-11 NOTE — Interval H&P Note (Signed)
History and Physical Interval Note:  02/11/2022 8:54 AM  Teresa Williamson  has presented today for surgery, with the diagnosis of left knee medial meniscus tear.  The various methods of treatment have been discussed with the patient and family. After consideration of risks, benefits and other options for treatment, the patient has consented to  Procedure(s): KNEE ARTHROSCOPY WITH MEDIAL MENISECTOMY (Left) as a surgical intervention.  The patient's history has been reviewed, patient examined, no change in status, stable for surgery.  I have reviewed the patient's chart and labs.  Questions were answered to the patient's satisfaction.     Arther Abbott

## 2022-02-11 NOTE — Anesthesia Postprocedure Evaluation (Signed)
Anesthesia Post Note  Patient: Teresa Williamson  Procedure(s) Performed: KNEE ARTHROSCOPY WITH MEDIAL MENISECTOMY (Left: Knee)  Patient location during evaluation: Phase II Anesthesia Type: General Level of consciousness: awake and alert and oriented Pain management: pain level controlled Vital Signs Assessment: post-procedure vital signs reviewed and stable Respiratory status: spontaneous breathing, nonlabored ventilation and respiratory function stable Cardiovascular status: blood pressure returned to baseline and stable Postop Assessment: no apparent nausea or vomiting Anesthetic complications: no   No notable events documented.   Last Vitals:  Vitals:   02/11/22 1045 02/11/22 1056  BP: 126/60 (!) 124/56  Pulse: (!) 107 (!) 103  Resp: (!) 22   Temp:  36.5 C  SpO2: 92% 94%    Last Pain:  Vitals:   02/11/22 1056  TempSrc: Oral  PainSc: 4                  Markas Aldredge C Mitsuye Schrodt

## 2022-02-11 NOTE — Telephone Encounter (Signed)
Received denial today from [facility] regarding her surgery / case denied apparently we are out of network. To you FYI  Surgery was done this am, just got the denial in box.

## 2022-02-11 NOTE — Op Note (Signed)
02/11/2022  10:21 AM  Knee arthroscopy dictation  Preop medial meniscus tear left knee    Postop diagnosis medial meniscus tear left knee  Procedure arthroscopy left knee with partial medial meniscectomy  Surgeon Aline Brochure  Operative findings  MEDIAL - meniscus undersurface tear medial meniscus at the body -articular surface normal  LATERAL - meniscus normal -articular surface Normal bone PTF   -articular surface grade II chondromalacia   NOTCH  -acl normal -pcl normal     The patient was identified in the preoperative holding area using 2 approved identification mechanisms. The chart was reviewed and updated. The surgical site was confirmed as left knee and marked with an indelible marker.  The patient was taken to the operating room for anesthesia. After successful general oral anesthesia, Ancef 2 g was used as IV antibiotics.  The patient was placed in the supine position with the (left knee) the operative extremity in an arthroscopic leg holder and the opposite extremity in a padded leg holder.  The timeout was executed.  A lateral portal was established with an 11 blade and the scope was introduced into the joint. A diagnostic arthroscopy was performed in circumferential manner examining the entire knee joint. A medial portal was established and the diagnostic arthroscopy was repeated using a probe to palpate intra-articular structures as they were encountered.    The medial meniscus was resected using a duckbill forceps. The meniscal fragments were removed with a motorized shaver. The meniscus was balanced with a combination of a motorized shaver and a 50 ArthroCare wand until a stable rim was obtained.  Loose fibrillation of the patella was resected with a shaver  The arthroscopic pump was placed on the wash mode and any excess debris was removed from the joint using suction.  60 cc of Marcaine with epinephrine was injected through the arthroscope.  The  portals were closed with 3-0 nylon suture.  A sterile bandage, Ace wrap and Cryo/Cuff was placed and the Cryo/Cuff was activated. The patient was taken to the recovery room in stable condition.  PHYSICIAN ASSISTANT: no  ASSISTANTS: none   ANESTHESIA:   General EBL:  none   BLOOD ADMINISTERED:none  DRAINS: none   LOCAL MEDICATIONS USED:  MARCAINE     SPECIMEN:  No Specimen  DISPOSITION OF SPECIMEN:  N/A  COUNTS:  YES   DICTATION: .Dragon Dictation  PLAN OF CARE: Discharge to home after PACU  PATIENT DISPOSITION:  PACU - hemodynamically stable.   Delay start of Pharmacological VTE agent (>24hrs) due to surgical blood loss or risk of bleeding: not applicable  POST OP PLAN  WB as tolerated SUTURES OUT IN A WEEK  AROM  BRACE not required

## 2022-02-11 NOTE — Transfer of Care (Addendum)
Immediate Anesthesia Transfer of Care Note  Patient: Teresa Williamson  Procedure(s) Performed: KNEE ARTHROSCOPY WITH MEDIAL MENISECTOMY (Left: Knee)  Patient Location: PACU  Anesthesia Type:General  Level of Consciousness: awake  Airway & Oxygen Therapy: Patient Spontanous Breathing  Post-op Assessment: Report given to RN  Post vital signs: Reviewed and stable  Last Vitals:  Vitals Value Taken Time  BP 130/58 02/11/22 1020  Temp 97.8   Pulse 116 02/11/22 1024  Resp 22 02/11/22 1024  SpO2 93 % 02/11/22 1024  Vitals shown include unvalidated device data.  Last Pain:  Vitals:   02/11/22 0813  PainSc: 5          Complications: No notable events documented.

## 2022-02-11 NOTE — Progress Notes (Signed)
Instructed on incentive spirometer. 2000 ml obtained. Tolerated well.

## 2022-02-11 NOTE — Anesthesia Procedure Notes (Signed)
Procedure Name: Intubation Date/Time: 02/11/2022 9:23 AM  Performed by: Ollen Bowl, CRNAPre-anesthesia Checklist: Patient identified, Patient being monitored, Timeout performed, Emergency Drugs available and Suction available Patient Re-evaluated:Patient Re-evaluated prior to induction Oxygen Delivery Method: Circle System Utilized Preoxygenation: Pre-oxygenation with 100% oxygen Induction Type: IV induction Ventilation: Mask ventilation without difficulty Laryngoscope Size: Mac and 3 Grade View: Grade I Tube type: Oral Tube size: 7.0 mm Number of attempts: 1 Airway Equipment and Method: stylet Placement Confirmation: ETT inserted through vocal cords under direct vision, positive ETCO2 and breath sounds checked- equal and bilateral Secured at: 21 cm Tube secured with: Tape Dental Injury: Teeth and Oropharynx as per pre-operative assessment

## 2022-02-11 NOTE — Brief Op Note (Signed)
02/11/2022  10:20 AM  PATIENT:  Teresa Williamson  56 y.o. female  PRE-OPERATIVE DIAGNOSIS:  left knee medial meniscus tear  POST-OPERATIVE DIAGNOSIS:  left knee medial meniscus tear  PROCEDURE:  Procedure(s): KNEE ARTHROSCOPY WITH MEDIAL MENISECTOMY (Left)  SURGEON:  Surgeon(s) and Role:    Carole Civil, MD - Primary  PHYSICIAN ASSISTANT:   ASSISTANTS: none   ANESTHESIA:   general  EBL:  10 mL   BLOOD ADMINISTERED:none  DRAINS: none   LOCAL MEDICATIONS USED:  MARCAINE     SPECIMEN:  No Specimen  DISPOSITION OF SPECIMEN:  N/A  COUNTS:  YES  TOURNIQUET:  * No tourniquets in log *  DICTATION: .Dragon Dictation  PLAN OF CARE: Discharge to home after PACU  PATIENT DISPOSITION:  PACU - hemodynamically stable.   Delay start of Pharmacological VTE agent (>24hrs) due to surgical blood loss or risk of bleeding: not applicable

## 2022-02-11 NOTE — Anesthesia Preprocedure Evaluation (Signed)
Anesthesia Evaluation  Patient identified by MRN, date of birth, ID band Patient awake    Reviewed: Allergy & Precautions, NPO status , Patient's Chart, lab work & pertinent test results  Airway Mallampati: II  TM Distance: >3 FB Neck ROM: Full    Dental  (+) Edentulous Upper, Edentulous Lower   Pulmonary shortness of breath and with exertion, COPD, Current Smoker and Patient abstained from smoking.,    Pulmonary exam normal breath sounds clear to auscultation       Cardiovascular + DOE  Normal cardiovascular exam Rhythm:Regular Rate:Normal     Neuro/Psych PSYCHIATRIC DISORDERS Anxiety Depression Bipolar Disorder negative neurological ROS     GI/Hepatic negative GI ROS, Neg liver ROS,   Endo/Other  Morbid obesity  Renal/GU negative Renal ROS  negative genitourinary   Musculoskeletal negative musculoskeletal ROS (+)   Abdominal   Peds negative pediatric ROS (+)  Hematology negative hematology ROS (+)   Anesthesia Other Findings   Reproductive/Obstetrics negative OB ROS                            Anesthesia Physical Anesthesia Plan  ASA: 3  Anesthesia Plan: General   Post-op Pain Management: Dilaudid IV   Induction: Intravenous  PONV Risk Score and Plan: 3 and Ondansetron, Dexamethasone and Midazolam  Airway Management Planned: Oral ETT  Additional Equipment:   Intra-op Plan:   Post-operative Plan: Extubation in OR  Informed Consent: I have reviewed the patients History and Physical, chart, labs and discussed the procedure including the risks, benefits and alternatives for the proposed anesthesia with the patient or authorized representative who has indicated his/her understanding and acceptance.     Dental advisory given  Plan Discussed with: CRNA and Surgeon  Anesthesia Plan Comments:        Anesthesia Quick Evaluation

## 2022-02-11 NOTE — Telephone Encounter (Signed)
Abigail Butts and I discussed, she did get an approval which states we are out of network but the patient has out of network coverage for a higher rate, so both were sent to be scanned.

## 2022-02-14 ENCOUNTER — Telehealth: Payer: Self-pay

## 2022-02-14 NOTE — Telephone Encounter (Signed)
Patient called stating that she had surgery on Tuesday 02/11/22 by Dr. Aline Brochure.  She is asking if she can return to work on Tuesday 02/18/22 to work 3 to 4 hours a day. 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 Aline Brochure agrees to this she will need a note faxed to her job.  Please advise her at [phone]

## 2022-02-17 ENCOUNTER — Encounter: Payer: Self-pay | Admitting: Orthopedic Surgery

## 2022-02-17 ENCOUNTER — Encounter (HOSPITAL_COMMUNITY): Payer: Self-pay | Admitting: Orthopedic Surgery

## 2022-02-17 ENCOUNTER — Telehealth: Payer: Self-pay | Admitting: Orthopedic Surgery

## 2022-02-17 NOTE — Telephone Encounter (Signed)
Faxed work note to [employer], attention human resources, Lavonda Jumbo, [id number], per patient's request and verbal authorization

## 2022-02-17 NOTE — Telephone Encounter (Signed)
Relayed to patient per Dr Ruthe Mannan approval. Patient relayed she will actually need to return Wednesday, 02/19/22, on this basis, and for 2 to 4 hours per day; aware of her post op appointment on 02/20/22. Done as per request.

## 2022-02-20 ENCOUNTER — Encounter: Payer: Self-pay | Admitting: Orthopedic Surgery

## 2022-02-20 ENCOUNTER — Ambulatory Visit (INDEPENDENT_AMBULATORY_CARE_PROVIDER_SITE_OTHER): Payer: BC Managed Care – PPO | Admitting: Orthopedic Surgery

## 2022-02-20 DIAGNOSIS — Z9889 Other specified postprocedural states: Secondary | ICD-10-CM | POA: Insufficient documentation

## 2022-02-20 NOTE — Progress Notes (Signed)
Chief Complaint  Patient presents with   Routine Post Op    Lt knee DOS 02/11/22   Postop day #9 status post arthroscopy of the left knee partial medial meniscectomy  She is doing well she returned to work today for couple hours and noticed some increased swelling in the knee.  She did her exercises did the ice and she is now just taking ibuprofen her range of motion is 0-1 25  She is ambulatory with no assistive device  I would like her to continue her exercises at quadriceps straight leg raises and terminal knee extensions continue her range of motion and come back in 3 to 4 weeks  We outlined a return to work program for her  Operative findings are noted below Postop diagnosis medial meniscus tear left knee  Procedure arthroscopy left knee with partial medial meniscectomy   Surgeon Aline Brochure   Operative findings   MEDIAL - meniscus undersurface tear medial meniscus at the body -articular surface normal   PTF   -articular surface grade II chondromalacia

## 2022-02-20 NOTE — Patient Instructions (Signed)
10/12 -10/16 3 hrs   10/17 - 10/22 6 hrs   10/23 full time

## 2022-03-13 ENCOUNTER — Encounter: Payer: Self-pay | Admitting: Orthopedic Surgery

## 2022-03-13 ENCOUNTER — Ambulatory Visit (INDEPENDENT_AMBULATORY_CARE_PROVIDER_SITE_OTHER): Payer: BC Managed Care – PPO | Admitting: Orthopedic Surgery

## 2022-03-13 DIAGNOSIS — M76821 Posterior tibial tendinitis, right leg: Secondary | ICD-10-CM

## 2022-03-13 DIAGNOSIS — Z9889 Other specified postprocedural states: Secondary | ICD-10-CM

## 2022-03-13 NOTE — Progress Notes (Signed)
FOLLOW UP   Encounter Diagnosis  Name Primary?   S/P arthroscopy of left knee 02/11/22 Yes     Chief Complaint  Patient presents with   Post-op Follow-up    Left knee / 02/11/22 has had increased pain with increased activity has returned to work with increase pain     Postop status post arthroscopy left knee partial medial meniscectomy on February 11, 2022 she is here for routine follow-up on last visit she was noticing some swelling after standing at work  Complaints noted if she is swollen on the left she has decreased range of motion compared to last time but she says she overdid it and she did not  We recommend rest, ice, decreased her exercises to just daily active range of motion only  Follow-up 3 to 4 weeks

## 2022-03-13 NOTE — Patient Instructions (Addendum)
Rest this weekend Ice 6 times daily 20-30 minutes only  Only exercise is knee bends this weekend  Ibuprofen 3 x daily   We are referring you to [facility] from [facility] [employer] address is [place] The phone number is [phone]  The office will call you with an appointment Dr. Sharol Given

## 2022-03-31 ENCOUNTER — Ambulatory Visit: Payer: BC Managed Care – PPO | Admitting: Orthopedic Surgery

## 2022-04-11 ENCOUNTER — Encounter: Payer: BC Managed Care – PPO | Admitting: Orthopedic Surgery

## 2022-04-14 ENCOUNTER — Encounter: Payer: Self-pay | Admitting: *Deleted

## 2022-04-18 ENCOUNTER — Ambulatory Visit (INDEPENDENT_AMBULATORY_CARE_PROVIDER_SITE_OTHER): Payer: BC Managed Care – PPO | Admitting: Orthopedic Surgery

## 2022-04-18 ENCOUNTER — Encounter: Payer: Self-pay | Admitting: Orthopedic Surgery

## 2022-04-18 DIAGNOSIS — Z9889 Other specified postprocedural states: Secondary | ICD-10-CM

## 2022-04-18 NOTE — Progress Notes (Unsigned)
Postop appointment regarding the patient's left knee  Encounter Diagnosis  Name Primary?   S/P arthroscopy of left knee 02/11/22 Yes     Teresa Williamson no longer has any swelling in her knee she just has stiffness at night that seems to resolve  She is moving it well controlling the knee well  No issues with walking  Follow-up as needed

## 2022-06-04 ENCOUNTER — Encounter: Payer: Self-pay | Admitting: *Deleted

## 2022-07-02 ENCOUNTER — Encounter: Payer: Self-pay | Admitting: Gastroenterology

## 2022-07-02 NOTE — Progress Notes (Deleted)
GI Office Note    Referring Provider: Lanelle Bal, PA-C Primary Care Physician:  Lanelle Bal, PA-C  Primary Gastroenterologist: ***  Chief Complaint   No chief complaint on file.  History of Present Illness   Teresa Williamson is a 57 y.o. female presenting today at the request of Lanelle Bal, PA-C for ***screening colonoscopy and RUQ and RLQ abdominal pain.  ***  PCP referral notes state 05/16/22 patient presented with diarrhea and nausea that started 3 days prior. Pain to RLQ and RUQ with radiation to the back, constant and dull. Also with loss of appetite. RUQ and epigastric tenderness. Patient called 05/28/22 reporting she would like colonoscopy and asked about referral.   Labs 05/15/22: Glucose 102, normal renal and liver function.  Hemoglobin 12.4, MCV 80, platelets 399.  Today:    Current Outpatient Medications  Medication Sig Dispense Refill   ARIPiprazole (ABILIFY) 20 MG tablet TAKE 1 TABLET BY MOUTH DAILY 90 tablet 0   lamoTRIgine (LAMICTAL) 200 MG tablet Take 1 tablet (200 mg total) by mouth daily. 90 tablet 3   No current facility-administered medications for this visit.    Past Medical History:  Diagnosis Date   Anxiety    Bipolar disorder ([place])    COPD (chronic obstructive pulmonary disease) ([place])    Depression     Past Surgical History:  Procedure Laterality Date   bladder reflux     age 69   CESAREAN SECTION     x 3   FOOT SURGERY Right    KNEE ARTHROSCOPY WITH MEDIAL MENISECTOMY Left 02/11/2022   Procedure: KNEE ARTHROSCOPY WITH MEDIAL MENISECTOMY;  Surgeon: Carole Civil, MD;  Location: [facility];  Service: Orthopedics;  Laterality: Left;   KNEE SURGERY Right    VAGINAL HYSTERECTOMY      Family History  Problem Relation Age of Onset   Cancer Paternal Grandfather        lung   Heart disease Maternal Grandmother    Cancer Maternal Grandfather        prostate   Other Mother        poor circulation in legs; tumor in intestines; was  removed   Thyroid disease Mother    Breast cancer Sister     Allergies as of 07/03/2022 - Review Complete 04/18/2022  Allergen Reaction Noted   Other Other (See Comments) 01/02/2017    Social History   Socioeconomic History   Marital status: Married    Spouse name: Not on file   Number of children: Not on file   Years of education: Not on file   Highest education level: Not on file  Occupational History   Not on file  Tobacco Use   Smoking status: Every Day    Packs/day: 1.00    Years: 35.00    Total pack years: 35.00    Types: Cigarettes   Smokeless tobacco: Never  Substance and Sexual Activity   Alcohol use: No   Drug use: No   Sexual activity: Not Currently    Birth control/protection: Surgical    Comment: hyst  Other Topics Concern   Not on file  Social History Narrative   Not on file   Social Determinants of Health   Financial Resource Strain: Not on file  Food Insecurity: Not on file  Transportation Needs: Not on file  Physical Activity: Not on file  Stress: Not on file  Social Connections: Not on file  Intimate Partner Violence: Not on file  Review of Systems   Gen: Denies any fever, chills, fatigue, weight loss, lack of appetite.  CV: Denies chest pain, heart palpitations, peripheral edema, syncope.  Resp: Denies shortness of breath at rest or with exertion. Denies wheezing or cough.  GI: see HPI GU : Denies urinary burning, urinary frequency, urinary hesitancy MS: Denies joint pain, muscle weakness, cramps, or limitation of movement.  Derm: Denies rash, itching, dry skin Psych: Denies depression, anxiety, memory loss, and confusion Heme: Denies bruising, bleeding, and enlarged lymph nodes.   Physical Exam   There were no vitals taken for this visit.  General:   Alert and oriented. Pleasant and cooperative. Well-nourished and well-developed.  Head:  Normocephalic and atraumatic. Eyes:  Without icterus, sclera clear and conjunctiva pink.   Ears:  Normal auditory acuity. Mouth:  No deformity or lesions, oral mucosa pink.  Lungs:  Clear to auscultation bilaterally. No wheezes, rales, or rhonchi. No distress.  Heart:  S1, S2 present without murmurs appreciated.  Abdomen:  +BS, soft, non-tender and non-distended. No HSM noted. No guarding or rebound. No masses appreciated.  Rectal:  Deferred  Msk:  Symmetrical without gross deformities. Normal posture. Extremities:  Without edema. Neurologic:  Alert and  oriented x4;  grossly normal neurologically. Skin:  Intact without significant lesions or rashes. Psych:  Alert and cooperative. Normal mood and affect.   Assessment   Teresa Williamson is a 57 y.o. female with a history of anxiety/depression, bipolar, COPD, tobacco use*** presenting today to schedule screening colonoscopy and ***  Screening for colon cancer:   RUQ pain:    PLAN   *** Proceed with colonoscopy ***with propofol by Dr. Marland Kitchen in near future: the risks, benefits, and alternatives have been discussed with the patient in detail. The patient states understanding and desires to proceed. ASA 2/3**??    Venetia Night, MSN, FNP-BC, AGACNP-BC [facility]

## 2022-07-03 ENCOUNTER — Ambulatory Visit: Payer: BC Managed Care – PPO | Admitting: Gastroenterology

## 2022-07-04 ENCOUNTER — Encounter: Payer: Self-pay | Admitting: Gastroenterology

## 2022-07-10 ENCOUNTER — Encounter: Payer: Self-pay | Admitting: Radiology

## 2022-10-29 ENCOUNTER — Encounter (INDEPENDENT_AMBULATORY_CARE_PROVIDER_SITE_OTHER): Payer: Self-pay | Admitting: Family Medicine

## 2022-11-27 ENCOUNTER — Encounter (INDEPENDENT_AMBULATORY_CARE_PROVIDER_SITE_OTHER): Payer: Self-pay | Admitting: Family Medicine

## 2023-10-12 ENCOUNTER — Ambulatory Visit: Admitting: Orthopedic Surgery

## 2023-11-02 ENCOUNTER — Ambulatory Visit: Admitting: Orthopedic Surgery

## 2023-11-17 ENCOUNTER — Ambulatory Visit: Admitting: Orthopedic Surgery

## 2023-12-03 ENCOUNTER — Encounter (INDEPENDENT_AMBULATORY_CARE_PROVIDER_SITE_OTHER): Payer: Self-pay | Admitting: *Deleted

## 2023-12-07 ENCOUNTER — Ambulatory Visit: Admitting: Orthopedic Surgery

## 2023-12-21 ENCOUNTER — Ambulatory Visit: Admitting: Orthopedic Surgery

## 2023-12-24 ENCOUNTER — Telehealth: Payer: Self-pay

## 2023-12-24 NOTE — Telephone Encounter (Signed)
 Who is your primary care physician: Rocky Joshua Don  Reasons for the colonoscopy: screening  Have you had a colonoscopy before?  no  Do you have family history of colon cancer? Yes mother  Previous colonoscopy with polyps removed? no  Do you have a history colorectal cancer?   no  Are you diabetic? If yes, Type 1 or Type 2?    Yes type 2  Do you have a prosthetic or mechanical heart valve? no  Do you have a pacemaker/defibrillator?   no  Have you had endocarditis/atrial fibrillation? no  Have you had joint replacement within the last 12 months?  no  Do you tend to be constipated or have to use laxatives? no  Do you have any history of drugs or alchohol?  no  Do you use supplemental oxygen?  no  Have you had a stroke or heart attack within the last 6 months? no  Do you take weight loss medication?  yes  For female patients: have you had a hysterectomy?  yes                                     are you post menopausal?       no                                            do you still have your menstrual cycle? no      Do you take any blood-thinning medications such as: (aspirin, warfarin, Plavix, Aggrenox)  no  If yes we need the name, milligram, dosage and who is prescribing doctor  Current Outpatient Medications on File Prior to Visit  Medication Sig Dispense Refill   ARIPiprazole  (ABILIFY ) 20 MG tablet TAKE 1 TABLET BY MOUTH DAILY 90 tablet 0   lamoTRIgine  (LAMICTAL ) 200 MG tablet Take 1 tablet (200 mg total) by mouth daily. 90 tablet 3   MOUNJARO 10 MG/0.5ML Pen Inject 10 mg into the skin once a week.     traMADol (ULTRAM) 50 MG tablet Take 100 mg by mouth 3 (three) times daily.     No current facility-administered medications on file prior to visit.    Allergies  Allergen Reactions   Other Other (See Comments)    Inflammatory med-unsure of name; stomach pain, vomiting, diarrhea     Pharmacy: [facility]  Primary Insurance Name: CHARON  [id number]  Best  number where you can be reached: [phone]

## 2023-12-24 NOTE — Telephone Encounter (Signed)
Ok to schedule.  Room : any   Thanks,  Vista Lawman, MD Gastroenterology and Hepatology [facility]

## 2023-12-25 NOTE — Telephone Encounter (Signed)
 LMOVM to call back

## 2024-01-01 ENCOUNTER — Encounter: Payer: Self-pay | Admitting: Radiology

## 2024-01-07 NOTE — Telephone Encounter (Signed)
 Letter mailed for pt to call back and schedule.

## 2024-02-08 ENCOUNTER — Ambulatory Visit: Admitting: Orthopedic Surgery

## 2024-03-11 ENCOUNTER — Telehealth: Payer: Self-pay | Admitting: Orthopedic Surgery

## 2024-03-11 NOTE — Telephone Encounter (Signed)
 CALLED PT LEFT VM PROVIDER OUT OF OFFICE NEED TO R/S/ with [place].Rocky Peters

## 2024-03-14 ENCOUNTER — Encounter: Payer: Self-pay | Admitting: Radiology

## 2024-03-31 ENCOUNTER — Ambulatory Visit: Admitting: Orthopedic Surgery

## 2024-05-23 ENCOUNTER — Ambulatory Visit: Admitting: Orthopedic Surgery

## 2024-06-07 ENCOUNTER — Encounter (INDEPENDENT_AMBULATORY_CARE_PROVIDER_SITE_OTHER): Payer: Self-pay | Admitting: *Deleted
